# Patient Record
Sex: Male | Born: 1955 | Race: Black or African American | Hispanic: No | Smoking: Current every day smoker
Health system: Southern US, Community
[De-identification: ages and names within clinical notes are randomized; demographics above are authoritative.]

## PROBLEM LIST (undated history)

## (undated) ENCOUNTER — Emergency Department (HOSPITAL_COMMUNITY): Payer: Self-pay | Source: Home / Self Care

## (undated) DIAGNOSIS — C679 Malignant neoplasm of bladder, unspecified: Secondary | ICD-10-CM

## (undated) DIAGNOSIS — K922 Gastrointestinal hemorrhage, unspecified: Secondary | ICD-10-CM

## (undated) DIAGNOSIS — I714 Abdominal aortic aneurysm, without rupture, unspecified: Secondary | ICD-10-CM

## (undated) DIAGNOSIS — R079 Chest pain, unspecified: Secondary | ICD-10-CM

## (undated) HISTORY — PX: OTHER SURGICAL HISTORY: SHX169

---

## 2007-02-14 ENCOUNTER — Emergency Department: Payer: Self-pay | Admitting: Unknown Physician Specialty

## 2007-04-23 ENCOUNTER — Emergency Department: Payer: Self-pay | Admitting: Emergency Medicine

## 2008-09-18 ENCOUNTER — Emergency Department: Payer: Self-pay

## 2008-09-25 ENCOUNTER — Emergency Department: Payer: Self-pay | Admitting: Internal Medicine

## 2008-10-06 ENCOUNTER — Emergency Department: Payer: Self-pay | Admitting: Emergency Medicine

## 2009-01-09 ENCOUNTER — Emergency Department: Payer: Self-pay | Admitting: Emergency Medicine

## 2009-08-12 ENCOUNTER — Emergency Department: Payer: Self-pay | Admitting: Emergency Medicine

## 2009-08-18 ENCOUNTER — Emergency Department: Payer: Self-pay | Admitting: Unknown Physician Specialty

## 2010-03-30 ENCOUNTER — Emergency Department: Payer: Self-pay | Admitting: Emergency Medicine

## 2010-08-25 ENCOUNTER — Emergency Department: Payer: Self-pay

## 2012-08-07 ENCOUNTER — Emergency Department: Payer: Self-pay | Admitting: Emergency Medicine

## 2015-11-01 DIAGNOSIS — K922 Gastrointestinal hemorrhage, unspecified: Secondary | ICD-10-CM

## 2015-11-01 HISTORY — DX: Gastrointestinal hemorrhage, unspecified: K92.2

## 2015-11-12 ENCOUNTER — Encounter: Payer: Self-pay | Admitting: Emergency Medicine

## 2015-11-12 ENCOUNTER — Emergency Department: Payer: Self-pay

## 2015-11-12 ENCOUNTER — Emergency Department
Admission: EM | Admit: 2015-11-12 | Discharge: 2015-11-12 | Disposition: A | Discharge status: Home / Self Care | Payer: Self-pay | Attending: Emergency Medicine | Admitting: Emergency Medicine

## 2015-11-12 DIAGNOSIS — K2971 Gastritis, unspecified, with bleeding: Secondary | ICD-10-CM | POA: Insufficient documentation

## 2015-11-12 DIAGNOSIS — I714 Abdominal aortic aneurysm, without rupture, unspecified: Secondary | ICD-10-CM

## 2015-11-12 DIAGNOSIS — Z8551 Personal history of malignant neoplasm of bladder: Secondary | ICD-10-CM | POA: Insufficient documentation

## 2015-11-12 DIAGNOSIS — K922 Gastrointestinal hemorrhage, unspecified: Secondary | ICD-10-CM

## 2015-11-12 DIAGNOSIS — F17200 Nicotine dependence, unspecified, uncomplicated: Secondary | ICD-10-CM | POA: Insufficient documentation

## 2015-11-12 HISTORY — DX: Malignant neoplasm of bladder, unspecified: C67.9

## 2015-11-12 LAB — COMPREHENSIVE METABOLIC PANEL
ALBUMIN: 3.8 g/dL (ref 3.5–5.0)
ALK PHOS: 101 U/L (ref 38–126)
ALT: 16 U/L — AB (ref 17–63)
AST: 29 U/L (ref 15–41)
Anion gap: 7 (ref 5–15)
BILIRUBIN TOTAL: 0.3 mg/dL (ref 0.3–1.2)
BUN: 14 mg/dL (ref 6–20)
CO2: 22 mmol/L (ref 22–32)
CREATININE: 0.84 mg/dL (ref 0.61–1.24)
Calcium: 9 mg/dL (ref 8.9–10.3)
Chloride: 108 mmol/L (ref 101–111)
GFR calc Af Amer: >60 mL/min (ref >60)
GLUCOSE: 133 mg/dL — AB (ref 65–99)
POTASSIUM: 3.4 mmol/L — AB (ref 3.5–5.1)
Sodium: 137 mmol/L (ref 135–145)
TOTAL PROTEIN: 7.3 g/dL (ref 6.5–8.1)

## 2015-11-12 LAB — CBC
HEMATOCRIT: 39.7 % — AB (ref 40.0–52.0)
Hemoglobin: 13.2 g/dL (ref 13.0–18.0)
MCH: 31.4 pg (ref 26.0–34.0)
MCHC: 33.3 g/dL (ref 32.0–36.0)
MCV: 94.2 fL (ref 80.0–100.0)
PLATELETS: 133 10*3/uL — AB (ref 150–440)
RBC: 4.22 MIL/uL — ABNORMAL LOW (ref 4.40–5.90)
RDW: 16.6 % — AB (ref 11.5–14.5)
WBC: 4.9 10*3/uL (ref 3.8–10.6)

## 2015-11-12 MED ORDER — IOPAMIDOL (ISOVUE-300) INJECTION 61%
100.0000 mL | Freq: Once | INTRAVENOUS | Status: AC | PRN
  Administered 2015-11-12: 100 mL via INTRAVENOUS

## 2015-11-12 MED ORDER — DIATRIZOATE MEGLUMINE & SODIUM 66-10 % PO SOLN
15.0000 mL | Freq: Once | ORAL | Status: AC
  Administered 2015-11-12: 15 mL via ORAL

## 2015-11-12 MED ORDER — DOCUSATE SODIUM 100 MG PO CAPS: 100.0000 mg | ORAL_CAPSULE | Freq: Every day | ORAL | Status: AC | PRN

## 2015-11-12 NOTE — Discharge Instructions (Signed)
As we discussed it is extremely important that you follow up with the vascular surgeon, Dr. Lucky Cowboy. The aneurysm that you have in your abdomen needs to be evaluated by him. Additionally you will need repeat imaging of your abdomen in 6-12 months. Furthermore we will give you the number for urology. Please use the Colace and increase the fiber in her diet to help decrease the amount of straining you're doing on the toilet. Please seek medical attention for any high fevers, chest pain, shortness of breath, change in behavior, persistent vomiting, bloody stool or any other new or concerning symptoms.   Gastrointestinal Bleeding Gastrointestinal bleeding is bleeding somewhere along the path that food travels through the body (digestive tract). This path is anywhere between the mouth and the opening of the butt (anus). You may have blood in your throw up (vomit) or in your poop (stools). If there is a lot of bleeding, you may need to stay in the hospital. John Norris  Only take medicine as told by your doctor.  Eat foods with fiber such as whole grains, fruits, and vegetables. You can also try eating 1 to 3 prunes a day.  Drink enough fluids to keep your pee (urine) clear or pale yellow. GET HELP RIGHT AWAY IF:   Your bleeding gets worse.  You feel dizzy, weak, or you pass out (faint).  You have bad cramps in your back or belly (abdomen).  You have large blood clumps (clots) in your poop.  Your problems are getting worse. MAKE SURE YOU:   Understand these instructions.  Will watch your condition.  Will get help right away if you are not doing well or get worse.   This information is not intended to replace advice given to you by your health care provider. Make sure you discuss any questions you have with your health care provider.   Document Released: 03/28/2008 Document Revised: 06/05/2012 Document Reviewed: 12/07/2014 Elsevier Interactive Patient Education 2016 [place].   Blood  pumps away from the heart through tubes (blood vessels) called arteries. Aneurysms are weak or damaged places in the wall of an artery. It bulges out like a balloon. An abdominal aortic aneurysm happens in the main artery of the body (aorta). It can burst or tear, causing bleeding inside the body. This is an emergency. It needs treatment right away. CAUSES  The exact cause is unknown. Things that could cause this problem include:  Fat and other substances building up in the lining of a tube.  Swelling of the walls of a blood vessel.  Certain tissue diseases.  Belly (abdominal) trauma.  An infection in the main artery of the body. RISK FACTORS There are things that make it more likely for you to have an aneurysm. These include:  Being over the age of 62 years old.  Having high blood pressure (hypertension).  Being a male.  Being white.  Being very overweight (obese).  Having a family history of aneurysm.  Using tobacco products. PREVENTION To lessen your chance of getting this condition:  Stop smoking. Stop chewing tobacco.  Limit or avoid alcohol.  Keep your blood pressure, blood sugar, and cholesterol within normal limits.  Eat less salt.  Eat foods low in saturated fats and cholesterol. These are found in animal and whole dairy products.  Eat more fiber. Fiber is found in whole grains, vegetables, and fruits.  Keep a healthy weight.  Stay active and exercise often. SYMPTOMS Symptoms depend on the size of the aneurysm and  how fast it grows. There may not be symptoms. If symptoms occur, they can include:  Pain (belly, side, lower back, or groin).  Feeling full after eating a small amount of food.  Feeling sick to your stomach (nauseous), throwing up (vomiting), or both.  Feeling a lump in your belly that feels like it is beating (pulsating).  Feeling like you will pass out (faint). TREATMENT   Medicine to control blood pressure and pain.  Imaging tests  to see if the aneurysm gets bigger.  Surgery. MAKE SURE YOU:   Understand these instructions.  Will watch your condition.  Will get help right away if you are not doing well or get worse.   This information is not intended to replace advice given to you by your health care provider. Make sure you discuss any questions you have with your health care provider.   Document Released: 10/14/2012 Document Reviewed: 10/14/2012 Elsevier Interactive Patient Education [employer].

## 2015-11-12 NOTE — ED Provider Notes (Signed)
[facility] Emergency Department Provider Note   ____________________________________________  Time seen: ~2040  I have reviewed the triage vital signs and the nursing notes.   HISTORY  Chief Complaint Rectal Bleeding   History limited by: Not Limited   HPI John Norris is a 60 y.o. male who presents to the emergency department today because of concerns for GI bleed as well as bloody ejaculate. The patient states that he has been having problems with GI bleeds for roughly 1 year. He states that he finds he has to strain to defecate. It is typically after he strains that he will have what he stools. Describes it as bright red blood. In addition the patient states that for the past 2 days he has noticed bloody ejaculate. He denies any pain with this. He denies similar symptoms in the past. Patient does state he has a history of bladder cancer. He denies any primary care. He denies any recent follow-up.   Past Medical History  Diagnosis Date  . Bladder cancer ([place])     There are no active problems to display for this patient.   History reviewed. No pertinent past surgical history.  No current outpatient prescriptions on file.  Allergies Review of patient's allergies indicates no known allergies.  No family history on file.  Social History Social History  Substance Use Topics  . Smoking status: Current Every Day Smoker  . Smokeless tobacco: None  . Alcohol Use: Yes    Review of Systems  Constitutional: Negative for fever. Cardiovascular: Negative for chest pain. Respiratory: Negative for shortness of breath. Gastrointestinal: Positive for bloody stools Genitourinary: Positive for bloody ejaculate Neurological: Negative for headaches, focal weakness or numbness.  10-point ROS otherwise negative.  ____________________________________________   PHYSICAL EXAM:  VITAL SIGNS: ED Triage Vitals  Enc Vitals Group     BP 11/12/15 1826  157/83 mmHg     Pulse Rate 11/12/15 1826 76     Resp 11/12/15 1826 18     Temp 11/12/15 1826 98.2 F (36.8 C)     Temp Source 11/12/15 1826 Oral     SpO2 11/12/15 1826 97 %     Weight 11/12/15 1826 175 lb (79.379 kg)     Height 11/12/15 1826 5' 11.5" (1.816 m)   Constitutional: Alert and oriented. Well appearing and in no distress. Eyes: Conjunctivae are normal. PERRL. Normal extraocular movements. ENT   Head: Normocephalic and atraumatic.   Nose: No congestion/rhinnorhea.   Mouth/Throat: Mucous membranes are moist.   Neck: No stridor. Hematological/Lymphatic/Immunilogical: No cervical lymphadenopathy. Cardiovascular: Normal rate, regular rhythm.  No murmurs, rubs, or gallops. Respiratory: Normal respiratory effort without tachypnea nor retractions. Breath sounds are clear and equal bilaterally. No wheezes/rales/rhonchi. Gastrointestinal: Soft and nontender. No distention. There is no CVA tenderness. Genitourinary: Deferred Musculoskeletal: Normal range of motion in all extremities. No joint effusions.  No lower extremity tenderness nor edema. Neurologic:  Normal speech and language. No gross focal neurologic deficits are appreciated.  Skin:  Skin is warm, dry and intact. No rash noted. Psychiatric: Mood and affect are normal. Speech and behavior are normal. Patient exhibits appropriate insight and judgment.  ____________________________________________    LABS (pertinent positives/negatives)  Labs Reviewed  COMPREHENSIVE METABOLIC PANEL - Abnormal; Notable for the following:    Potassium 3.4 (*)    Glucose, Bld 133 (*)    ALT 16 (*)    All other components within normal limits  CBC - Abnormal; Notable for the following:  RBC 4.22 (*)    HCT 39.7 (*)    RDW 16.6 (*)    Platelets 133 (*)    All other components within normal limits     ____________________________________________   EKG  None  ____________________________________________     RADIOLOGY  CT abd/pel IMPRESSION: Moderate stool throughout the colon. No evidence of bowel obstruction or active inflammation. Normal appendix.  A 5 cm partially thrombosed infrarenal abdominal aortic aneurysm, new from prior study. Recommend followup by abdomen and pelvis CTA in 3-6 months, and vascular surgery referral/consultation if not already obtained. This recommendation follows ACR consensus guidelines: White Paper of the ACR Incidental Findings Committee II on Vascular Findings. J Am Coll Radiol 2013; 10:789-794.  Bilateral common iliac artery aneurysm.   ____________________________________________   PROCEDURES  Procedure(s) performed: None  Critical Care performed: No  ____________________________________________   INITIAL IMPRESSION / ASSESSMENT AND PLAN / ED COURSE  Pertinent labs & imaging results that were available during my care of the patient were reviewed by me and considered in my medical decision making (see chart for details).  Patient presents to the emergency department today because of concerns for GI bleeding and bloody ejaculate. The patient said somewhat chronic issue with GI bleed. He does say is worse with strains. Think likely this could be secondary to hemorrhoids. However he is now also complaining of bloody ejaculate. Patient does have a history of bladder cancer and appears has not had any recent follow-up. Go my concern for possible new cancer lesion will obtain a CT scan of the abdomen and pelvis.  ____________________________________________   FINAL CLINICAL IMPRESSION(S) / ED DIAGNOSES  Final diagnoses:  Gastrointestinal hemorrhage, unspecified gastritis, unspecified gastrointestinal hemorrhage type  Abdominal aortic aneurysm (AAA) without rupture (HCC)     Nance Pear, MD 11/12/15 2334

## 2015-11-12 NOTE — ED Notes (Signed)
Informed CT that pt has completed contrast and IV has been established.

## 2015-11-12 NOTE — ED Notes (Signed)
Discussed IV options with patient, AC placement preferred. 

## 2015-11-12 NOTE — ED Notes (Signed)
Pt has completed drinking oral contrast, calling CT to come get him for his scan.

## 2015-11-12 NOTE — ED Notes (Signed)
Patient to ER for c/o rectal bleeding. States he notices bleeding more when straining to have BM. Has h/o bladder cancer, and had blood in urine as first symptom.

## 2015-11-12 NOTE — ED Notes (Signed)
Patient also c/o blood in sperm, stating it is "sort of dark red" and he has not had any trauma to the area he can recall.

## 2015-11-12 NOTE — ED Notes (Signed)
Patient requested me to talk to his daughter regarding his AAA findings.  I urged the need for him to follow up with a cardiologist ASAP.

## 2015-12-18 ENCOUNTER — Emergency Department
Admission: EM | Admit: 2015-12-18 | Discharge: 2015-12-18 | Payer: Self-pay | Attending: Emergency Medicine | Admitting: Emergency Medicine

## 2015-12-18 ENCOUNTER — Ambulatory Visit (HOSPITAL_COMMUNITY): Admit: 2015-12-18 | Payer: Self-pay | Admitting: Cardiology

## 2015-12-18 ENCOUNTER — Inpatient Hospital Stay (HOSPITAL_COMMUNITY)
Admission: EM | Admit: 2015-12-18 | Discharge: 2015-12-20 | DRG: 287 | Disposition: A | Discharge status: Home / Self Care | Payer: Self-pay | Source: Other Acute Inpatient Hospital | Attending: Cardiology | Admitting: Cardiology

## 2015-12-18 ENCOUNTER — Encounter
Admission: EM | Disposition: A | Discharge status: Other Acute Inpatient Hospital | Payer: Self-pay | Source: Home / Self Care | Attending: Emergency Medicine

## 2015-12-18 ENCOUNTER — Emergency Department: Payer: Self-pay

## 2015-12-18 ENCOUNTER — Encounter: Payer: Self-pay | Admitting: *Deleted

## 2015-12-18 ENCOUNTER — Encounter (HOSPITAL_COMMUNITY): Payer: Self-pay | Admitting: *Deleted

## 2015-12-18 DIAGNOSIS — I2119 ST elevation (STEMI) myocardial infarction involving other coronary artery of inferior wall: Secondary | ICD-10-CM | POA: Diagnosis present

## 2015-12-18 DIAGNOSIS — I209 Angina pectoris, unspecified: Secondary | ICD-10-CM | POA: Insufficient documentation

## 2015-12-18 DIAGNOSIS — I714 Abdominal aortic aneurysm, without rupture: Secondary | ICD-10-CM | POA: Diagnosis present

## 2015-12-18 DIAGNOSIS — E785 Hyperlipidemia, unspecified: Secondary | ICD-10-CM | POA: Diagnosis present

## 2015-12-18 DIAGNOSIS — I713 Abdominal aortic aneurysm, ruptured, unspecified: Secondary | ICD-10-CM | POA: Diagnosis present

## 2015-12-18 DIAGNOSIS — F17200 Nicotine dependence, unspecified, uncomplicated: Secondary | ICD-10-CM | POA: Insufficient documentation

## 2015-12-18 DIAGNOSIS — Z8551 Personal history of malignant neoplasm of bladder: Secondary | ICD-10-CM

## 2015-12-18 DIAGNOSIS — I2510 Atherosclerotic heart disease of native coronary artery without angina pectoris: Secondary | ICD-10-CM

## 2015-12-18 DIAGNOSIS — Z87891 Personal history of nicotine dependence: Secondary | ICD-10-CM

## 2015-12-18 DIAGNOSIS — R072 Precordial pain: Principal | ICD-10-CM | POA: Diagnosis present

## 2015-12-18 DIAGNOSIS — Z79899 Other long term (current) drug therapy: Secondary | ICD-10-CM

## 2015-12-18 DIAGNOSIS — I213 ST elevation (STEMI) myocardial infarction of unspecified site: Secondary | ICD-10-CM | POA: Insufficient documentation

## 2015-12-18 HISTORY — DX: Chest pain, unspecified: R07.9

## 2015-12-18 HISTORY — DX: Abdominal aortic aneurysm, without rupture: I71.4

## 2015-12-18 HISTORY — PX: CARDIAC CATHETERIZATION: SHX172

## 2015-12-18 HISTORY — DX: Gastrointestinal hemorrhage, unspecified: K92.2

## 2015-12-18 HISTORY — DX: Abdominal aortic aneurysm, without rupture, unspecified: I71.40

## 2015-12-18 LAB — TROPONIN I
Troponin I: <0.03 ng/mL (ref <0.031)
Troponin I: <0.03 ng/mL (ref <0.031)

## 2015-12-18 LAB — COMPREHENSIVE METABOLIC PANEL
ALBUMIN: 4 g/dL (ref 3.5–5.0)
ALT: 13 U/L — ABNORMAL LOW (ref 17–63)
AST: 22 U/L (ref 15–41)
Alkaline Phosphatase: 97 U/L (ref 38–126)
Anion gap: 9 (ref 5–15)
BUN: 20 mg/dL (ref 6–20)
CHLORIDE: 109 mmol/L (ref 101–111)
CO2: 19 mmol/L — AB (ref 22–32)
Calcium: 9.2 mg/dL (ref 8.9–10.3)
Creatinine, Ser: 1.04 mg/dL (ref 0.61–1.24)
GFR calc Af Amer: >60 mL/min (ref >60)
Glucose, Bld: 61 mg/dL — ABNORMAL LOW (ref 65–99)
POTASSIUM: 4.2 mmol/L (ref 3.5–5.1)
SODIUM: 137 mmol/L (ref 135–145)
Total Bilirubin: 0.6 mg/dL (ref 0.3–1.2)
Total Protein: 8.4 g/dL — ABNORMAL HIGH (ref 6.5–8.1)

## 2015-12-18 LAB — PROTIME-INR
INR: 1.05
Prothrombin Time: 13.9 seconds (ref 11.4–15.0)

## 2015-12-18 LAB — CBC WITH DIFFERENTIAL/PLATELET
BASOS ABS: 0 10*3/uL (ref 0–0.1)
BASOS PCT: 1 %
EOS ABS: 0 10*3/uL (ref 0–0.7)
EOS PCT: 1 %
HCT: 42.4 % (ref 40.0–52.0)
Hemoglobin: 14.2 g/dL (ref 13.0–18.0)
Lymphocytes Relative: 50 %
Lymphs Abs: 2.6 10*3/uL (ref 1.0–3.6)
MCH: 30.7 pg (ref 26.0–34.0)
MCHC: 33.5 g/dL (ref 32.0–36.0)
MCV: 91.6 fL (ref 80.0–100.0)
MONO ABS: 0.4 10*3/uL (ref 0.2–1.0)
Monocytes Relative: 7 %
Neutro Abs: 2.1 10*3/uL (ref 1.4–6.5)
Neutrophils Relative %: 41 %
PLATELETS: 136 10*3/uL — AB (ref 150–440)
RBC: 4.62 MIL/uL (ref 4.40–5.90)
RDW: 15.2 % — AB (ref 11.5–14.5)
WBC: 5.2 10*3/uL (ref 3.8–10.6)

## 2015-12-18 LAB — TSH: TSH: 0.256 u[IU]/mL — AB (ref 0.350–4.500)

## 2015-12-18 LAB — SEDIMENTATION RATE: SED RATE: 11 mm/h (ref 0–16)

## 2015-12-18 LAB — ETHANOL: ALCOHOL ETHYL (B): 24 mg/dL — AB (ref <5)

## 2015-12-18 LAB — APTT: aPTT: 33 seconds (ref 24–36)

## 2015-12-18 SURGERY — LEFT HEART CATH AND CORONARY ANGIOGRAPHY
Anesthesia: LOCAL

## 2015-12-18 MED ORDER — HEPARIN (PORCINE) IN NACL 2-0.9 UNIT/ML-% IJ SOLN
INTRAMUSCULAR | Status: DC | PRN
  Administered 2015-12-18: 08:00:00 via INTRA_ARTERIAL

## 2015-12-18 MED ORDER — ASPIRIN 81 MG PO CHEW
CHEWABLE_TABLET | ORAL | Status: AC
  Administered 2015-12-18: 324 mg via ORAL
  Filled 2015-12-18: qty 4

## 2015-12-18 MED ORDER — ACETAMINOPHEN 325 MG PO TABS: 650.0000 mg | ORAL_TABLET | ORAL | Status: DC | PRN

## 2015-12-18 MED ORDER — NITROGLYCERIN 0.4 MG SL SUBL: 0.4000 mg | SUBLINGUAL_TABLET | SUBLINGUAL | Status: DC | PRN

## 2015-12-18 MED ORDER — ONDANSETRON HCL 4 MG/2ML IJ SOLN: 4.0000 mg | Freq: Four times a day (QID) | INTRAMUSCULAR | Status: DC | PRN

## 2015-12-18 MED ORDER — NITROGLYCERIN 2 % TD OINT
TOPICAL_OINTMENT | TRANSDERMAL | Status: AC
  Administered 2015-12-18: 0.5 [in_us]
  Filled 2015-12-18: qty 1

## 2015-12-18 MED ORDER — HEPARIN SODIUM (PORCINE) 1000 UNIT/ML IJ SOLN: INTRAMUSCULAR | Status: DC | PRN

## 2015-12-18 MED ORDER — HEPARIN SODIUM (PORCINE) 5000 UNIT/ML IJ SOLN: 60.0000 [IU]/kg | Freq: Once | INTRAMUSCULAR | Status: DC

## 2015-12-18 MED ORDER — FENTANYL CITRATE (PF) 100 MCG/2ML IJ SOLN
INTRAMUSCULAR | Status: DC | PRN
  Administered 2015-12-18: 25 ug via INTRAVENOUS

## 2015-12-18 MED ORDER — HEPARIN BOLUS VIA INFUSION
4000.0000 [IU] | Freq: Once | INTRAVENOUS | Status: AC
  Administered 2015-12-18: 4000 [IU] via INTRAVENOUS
  Filled 2015-12-18: qty 4000

## 2015-12-18 MED ORDER — ENOXAPARIN SODIUM 40 MG/0.4ML ~~LOC~~ SOLN
40.0000 mg | SUBCUTANEOUS | Status: DC
Start: 2015-12-19 — End: 2015-12-20
  Administered 2015-12-18 – 2015-12-19 (×2): 40 mg via SUBCUTANEOUS
  Filled 2015-12-18 (×2): qty 0.4

## 2015-12-18 MED ORDER — HEPARIN (PORCINE) IN NACL 100-0.45 UNIT/ML-% IJ SOLN: 14.0000 [IU]/kg/h | Freq: Once | INTRAMUSCULAR | Status: DC

## 2015-12-18 MED ORDER — SODIUM CHLORIDE 0.9 % IV SOLN: 250.0000 mL | INTRAVENOUS | Status: DC | PRN

## 2015-12-18 MED ORDER — SODIUM CHLORIDE 0.9% FLUSH: 3.0000 mL | INTRAVENOUS | Status: DC | PRN

## 2015-12-18 MED ORDER — ZOLPIDEM TARTRATE 5 MG PO TABS: 5.0000 mg | ORAL_TABLET | Freq: Every evening | ORAL | Status: DC | PRN

## 2015-12-18 MED ORDER — IOPAMIDOL (ISOVUE-370) INJECTION 76%
INTRAVENOUS | Status: DC | PRN
  Administered 2015-12-18: 85 mL via INTRA_ARTERIAL

## 2015-12-18 MED ORDER — ATORVASTATIN CALCIUM 80 MG PO TABS
80.0000 mg | ORAL_TABLET | Freq: Every day | ORAL | Status: DC
  Administered 2015-12-18 – 2015-12-19 (×2): 80 mg via ORAL
  Filled 2015-12-18 (×2): qty 1

## 2015-12-18 MED ORDER — ASPIRIN 81 MG PO CHEW
324.0000 mg | CHEWABLE_TABLET | Freq: Once | ORAL | Status: AC
  Administered 2015-12-18: 324 mg via ORAL

## 2015-12-18 MED ORDER — HEPARIN SODIUM (PORCINE) 5000 UNIT/ML IJ SOLN
INTRAMUSCULAR | Status: AC
  Filled 2015-12-18: qty 1

## 2015-12-18 MED ORDER — SODIUM CHLORIDE 0.9% FLUSH
3.0000 mL | Freq: Two times a day (BID) | INTRAVENOUS | Status: DC
  Administered 2015-12-19 (×2): 3 mL via INTRAVENOUS

## 2015-12-18 MED ORDER — HEPARIN (PORCINE) IN NACL 100-0.45 UNIT/ML-% IJ SOLN
950.0000 [IU]/h | INTRAMUSCULAR | Status: DC
  Administered 2015-12-18: 950 [IU]/h via INTRAVENOUS
  Filled 2015-12-18: qty 250

## 2015-12-18 MED ORDER — ALPRAZOLAM 0.25 MG PO TABS: 0.2500 mg | ORAL_TABLET | Freq: Two times a day (BID) | ORAL | Status: DC | PRN

## 2015-12-18 MED ORDER — HEPARIN (PORCINE) IN NACL 2-0.9 UNIT/ML-% IJ SOLN
INTRAMUSCULAR | Status: DC | PRN
  Administered 2015-12-18: 1000 mL

## 2015-12-18 MED ORDER — LIDOCAINE HCL (PF) 1 % IJ SOLN
INTRAMUSCULAR | Status: DC | PRN
  Administered 2015-12-18: 2 mL

## 2015-12-18 SURGICAL SUPPLY — 11 items
CATH INFINITI 5FR ANG PIGTAIL (CATHETERS) ×2 IMPLANT
CATH OPTITORQUE TIG 4.0 5F (CATHETERS) ×2 IMPLANT
DEVICE RAD COMP TR BAND LRG (VASCULAR PRODUCTS) ×2 IMPLANT
ELECT DEFIB PAD ADLT CADENCE (PAD) ×2 IMPLANT
GLIDESHEATH SLEND A-KIT 6F 22G (SHEATH) ×2 IMPLANT
KIT HEART LEFT (KITS) ×2 IMPLANT
PACK CARDIAC CATHETERIZATION (CUSTOM PROCEDURE TRAY) ×2 IMPLANT
SYR MEDRAD MARK V 150ML (SYRINGE) ×2 IMPLANT
TRANSDUCER W/STOPCOCK (MISCELLANEOUS) ×2 IMPLANT
TUBING CIL FLEX 10 FLL-RA (TUBING) ×2 IMPLANT
WIRE SAFE-T 1.5MM-J .035X260CM (WIRE) ×2 IMPLANT

## 2015-12-18 NOTE — ED Notes (Signed)
Pt presents w/ sudden onset of L chest pain, non-radiating and reproducible w/ movement and palpation. Pt denies associated sxs of dyspnea and diaphoresis.

## 2015-12-18 NOTE — ED Provider Notes (Signed)
[facility] Emergency Department Provider Note   ____________________________________________  Time seen: Approximately 5:39 AM  I have reviewed the triage vital signs and the nursing notes.   HISTORY  Chief Complaint Chest Pain    HPI SUTTON LESSMAN is a 60 y.o. male who presents to the ED from home with a chief complaint of chest pain. Patient reports left sided sharp chest pain awakening him from sleep at 5 AM. Pain is worsened by palpation and movement. Symptoms are not associated with diaphoresis, shortness of breath, nausea or vomiting. Recently patient had an evaluation in the emergency department for rectal bleeding. CT had an incidental finding of partially thrombosed 5 cm aortic aneurysm without rupture; patient states he was discharged with vascular surgery follow-up.Complains of cough productive of yellow sputum. Denies associated fever, chills, abdominal pain, diarrhea.   Past Medical History  Diagnosis Date  . Bladder cancer ([place])     There are no active problems to display for this patient.   No past surgical history on file.  Current Outpatient Rx  Name  Route  Sig  Dispense  Refill  . docusate sodium (COLACE) 100 MG capsule   Oral   Take 1 capsule (100 mg total) by mouth daily as needed.   30 capsule   2     Allergies Review of patient's allergies indicates no known allergies.  No family history on file.  Social History Social History  Substance Use Topics  . Smoking status: Current Every Day Smoker  . Smokeless tobacco: Not on file  . Alcohol Use: Yes  Denies illicit drug use  Review of Systems  Constitutional: No fever/chills Eyes: No visual changes. ENT: No sore throat. Cardiovascular: Positive for chest pain. Respiratory: Denies shortness of breath. Gastrointestinal: No abdominal pain.  No nausea, no vomiting.  No diarrhea.  No constipation. Genitourinary: Negative for dysuria. Musculoskeletal: Negative  for back pain. Skin: Negative for rash. Neurological: Negative for headaches, focal weakness or numbness.  10-point ROS otherwise negative.  ____________________________________________   PHYSICAL EXAM:  VITAL SIGNS: ED Triage Vitals  Enc Vitals Group     BP --      Pulse --      Resp --      Temp --      Temp src --      SpO2 --      Weight --      Height --      Head Cir --      Peak Flow --      Pain Score --      Pain Loc --      Pain Edu? --      Excl. in [place]? --     Constitutional: Alert and oriented. Well appearing and in mild acute distress. Eyes: Conjunctivae are normal. PERRL. EOMI. Head: Atraumatic. Nose: No congestion/rhinnorhea. Mouth/Throat: Mucous membranes are moist.  Oropharynx non-erythematous. Neck: No stridor.   Cardiovascular: Normal rate, regular rhythm. Grossly normal heart sounds.  Good peripheral circulation. Respiratory: Normal respiratory effort.  No retractions. Lungs CTAB. Gastrointestinal: Soft and nontender. No distention. No abdominal bruits. No CVA tenderness. Musculoskeletal: No lower extremity tenderness nor edema.  No joint effusions. Neurologic:  Normal speech and language. No gross focal neurologic deficits are appreciated.  Skin:  Skin is warm, dry and intact. No rash noted. Psychiatric: Mood and affect are normal. Speech and behavior are normal.  ____________________________________________   LABS (all labs ordered are listed, but only abnormal  results are displayed)  Labs Reviewed  ETHANOL  CBC WITH DIFFERENTIAL/PLATELET  COMPREHENSIVE METABOLIC PANEL  TROPONIN I   ____________________________________________  EKG  ED ECG REPORT I, Dontaye Hur J, the attending physician, personally viewed and interpreted this ECG.   Date: 12/18/2015  EKG Time: 0528  Rate: 75  Rhythm: normal EKG, normal sinus rhythm  Axis: WNL  Intervals:none  ST&T Change: STEMI  inferiorly  ____________________________________________  RADIOLOGY  Chest x-ray (viewed by me, interpreted per Dr. Jeannine Boga): No active cardiopulmonary disease. ____________________________________________   PROCEDURES  Procedure(s) performed: None  Critical Care performed: No  ____________________________________________   INITIAL IMPRESSION / ASSESSMENT AND PLAN / ED COURSE  Pertinent labs & imaging results that were available during my care of the patient were reviewed by me and considered in my medical decision making (see chart for details).  60 year old male who presents with chest pain. EKG is concerning for inferior ST elevation MI. Discussed with Dr. Ellyn Hack (interventional cardiologist on-call for [facility]) who agrees and accepts patient in transfer to the Cath Lab. 324 mg aspirin administered. Will initiate heparin bolus with drip and nitroglycerin paste. ____________________________________________   FINAL CLINICAL IMPRESSION(S) / ED DIAGNOSES  Final diagnoses:  Ischemic chest pain ([place])  ST elevation myocardial infarction (STEMI), unspecified artery (HCC)      NEW MEDICATIONS STARTED DURING THIS VISIT:  New Prescriptions   No medications on file     Note:  This document was prepared using Dragon voice recognition software and may include unintentional dictation errors.    Paulette Blanch, MD 12/18/15 [phone]

## 2015-12-18 NOTE — H&P (Signed)
History and Physical Note:   NAME:  John Norris   MRN: [id number] DOB:  10/29/1955   ADMIT DATE: 12/18/2015  12/18/2015 6:52 AM  John Norris is a 60 y.o. male long-term smoker with bladder cancer and recent possible GI bleed, who presented to [facility] emergency room at roughly 5:45 AM on 12/18/2015 after having been awoken from sleep at roughly for the morning with left-sided chest pain that radiated to the arm. She felt that it was possibly musculoskeletal, but when the symptoms progressed to become the ER. In the ER he was evaluated for a gamma was thought to be musculoskeletal pain, however his EKG was concerning for ST elevations in the inferior leads with strange morphology. There is also acute ST segments in a corneal leads. Reviewed the EKGs and there indeed roughly 2 mm to elevations in leads II, room 3 and aVF with potentially real early report changes and hyperacute T waves in leads V2-V6.   Upon arrival to [facility] at 0640 hrs., he was still having 3 out 10 pain down from initial 7 out of 10. At [facility] he received 4000 units IV heparin and was on heparin drip. He also received 324 of aspirin and was started on nitroglycerin paste.  Past Medical History  Diagnosis Date  . Bladder cancer ([place])   . Abdominal aortic aneurysm (AAA) without rupture (HCC)     Roughly 5 cm partially thrombotic. Also, treated by bilateral iliac aneurysms  . GI bleed May 2017   Past Surgical History  Procedure Laterality Date  . Exploratory abdominal surgery      4 what was possibly appendicitis versus tumor    - He did have an abdominal surgery to remove some type of undiagnosed tumor versus appendicitis. He has a large abdominal incision scar.  FAMHx: History reviewed. No pertinent family history. He is not aware of any cardiac disease history in his family  SOCHx:  reports that he has been smoking.  He has never used smokeless tobacco. He reports that he drinks alcohol. He reports that he does  not use illicit drugs.  ALLERGIES: No Known Allergies  HOME MEDICATIONS: Prior to Admission medications   Medication Sig Start Date End Date Taking? Authorizing Provider  docusate sodium (COLACE) 100 MG capsule Take 1 capsule (100 mg total) by mouth daily as needed. 11/12/15 11/11/16  Nance Pear, MD   Review of Systems  Constitutional: Negative for fever.  HENT: Negative for nosebleeds.   Cardiovascular: Positive for chest pain, orthopnea and PND. Negative for palpitations and claudication.  Gastrointestinal: Positive for blood in stool. Negative for heartburn, abdominal pain and melena.  Genitourinary: Positive for frequency and hematuria.  Musculoskeletal: Negative for myalgias and joint pain.  Neurological: Negative for dizziness, loss of consciousness, weakness and headaches.  Endo/Heme/Allergies: Does not bruise/bleed easily.  Psychiatric/Behavioral: Negative for depression and memory loss. The patient is not nervous/anxious and does not have insomnia.   All other systems reviewed and are negative.   PHYSICAL EXAM:Blood pressure 128/92, pulse 72, temperature 97.7 F (36.5 C), temperature source Oral, resp. rate 18, height 5\' 6"  (1.676 m), weight 180 lb (81.647 kg), SpO2 98 %. General appearance: alert, cooperative, appears stated age, moderate distress and Appears uncomfortable, but nontoxic. Pleasant mood and affect Neck: no adenopathy, no carotid bruit and no JVD Lungs: clear to auscultation bilaterally, normal percussion bilaterally and Nonlabored, good air movement Heart: normal apical impulse and Tachycardic with regular rhythm. Occasional ectopy. No obvious M/R/G Abdomen:  soft, non-tender; bowel sounds normal; no masses,  no organomegaly Extremities: No clubbing/cyanosis or edema. Pulses: 2+ and symmetric Skin: Skin color, texture, turgor normal. No rashes or lesions Neurologic: Mental status: Alert, oriented, thought content appropriate Cranial nerves: normal    Adult ECG Report  Rate: 75 ;  Rhythm: normal sinus rhythm  QRS Axis: 74 ;  PR Interval: 14 ;  QRS Duration: 94 ; QTc: 417;  Voltages: Normal There is roughly 2 mm ST elevations in II, Ronald 3 and aVF with mild Q waves. Also hyperacute ST segments/T waves in V2-V6. Subtle criteria however injury pattern ST segment elevation cannot be excluded  Narrative Interpretation: Abnormal EKG. Cannot exclude inferior/lateral STEMI  IMPRESSION & PLAN LAYSON LIGOCKI has presented today for surgery, with the diagnosis of inferior-inferolateral STEMI.  Various methods of treatment have been discussed with the patient and family.   Risks / Complications include, but not limited to: Death, MI, CVA/TIA, VF/VT (with defibrillation), Bradycardia (need for temporary pacer placement), contrast induced nephropathy, bleeding / bruising / hematoma / pseudoaneurysm, vascular or coronary injury (with possible emergent CT or Vascular Surgery), adverse medication reactions, infection.     After consideration of risks, benefits and other options for treatment, the patient has consented to Procedure(s): Emergency consent implied [place] +/- AD [place]   as a surgical intervention.   We will proceed with the planned procedure. Would likely consider bare metal stent given his recent GI bleed and hematuria from bladder cancer. Further recommendations pending results of cath.     Glenetta Hew, M.D., M.S. Interventional Cardiologist   Pager # [phone] Phone # [phone] [address] [place], [place] [address]   12/18/2015 6:52 AM

## 2015-12-18 NOTE — Progress Notes (Addendum)
ANTICOAGULATION CONSULT NOTE - Initial Consult  Pharmacy Consult for heparin drip Indication: STEMI  No Known Allergies  Patient Measurements: Height: 5\' 6"  (167.6 cm) Weight: 180 lb (81.647 kg) IBW/kg (Calculated) : 63.8 Heparin Dosing Weight: 80kg  Vital Signs: Temp: 97.7 F (36.5 C) (06/17 0559) Temp Source: Oral (06/17 0559) BP: 128/92 mmHg (06/17 0600) Pulse Rate: 72 (06/17 0600)  Labs:  Recent Labs  12/18/15 0545  HGB 14.2  HCT 42.4  PLT 136*    CrCl cannot be calculated (Patient has no serum creatinine result on file.).   Medical History: Past Medical History  Diagnosis Date  . Bladder cancer (HCC)     Medications:  No anticoagulation as outpatient per PTA meds.  Assessment: PT/INR and aPTT ordered. First heparin bag sent to ED per RN/MD urgent request. Asked ED RN to draw baseline labs before starting infusion.   Goal of Therapy:  Heparin level 0.3-0.7 units/ml Monitor platelets by anticoagulation protocol: Yes   Plan:  4000 unit bolus and initial rate of 950 units/hr. First heparin level 6 hours after start of infusion.  06:45: Patient has transferred to another facility.  Yunique Dearcos S 12/18/2015,6:12 AM

## 2015-12-19 ENCOUNTER — Observation Stay (HOSPITAL_BASED_OUTPATIENT_CLINIC_OR_DEPARTMENT_OTHER): Payer: Self-pay

## 2015-12-19 DIAGNOSIS — R079 Chest pain, unspecified: Secondary | ICD-10-CM

## 2015-12-19 DIAGNOSIS — I2119 ST elevation (STEMI) myocardial infarction involving other coronary artery of inferior wall: Secondary | ICD-10-CM

## 2015-12-19 LAB — ECHOCARDIOGRAM COMPLETE
CHL CUP MV DEC (S): 257
E decel time: 257 msec
EERAT: 4.28
FS: 15 % — AB (ref 28–44)
Height: 66 in
IV/PV OW: 0.9
LA diam index: 1.64 cm/m2
LA vol A4C: 39 ml
LA vol index: 21.2 mL/m2
LASIZE: 31 mm
LAVOL: 40.1 mL
LEFT ATRIUM END SYS DIAM: 31 mm
LV E/e' medial: 4.28
LV PW d: 11.6 mm — AB (ref 0.6–1.1)
LV TDI E'MEDIAL: 7.72
LVEEAVG: 4.28
LVELAT: 12 cm/s
LVOT area: 3.46 cm2
LVOTD: 21 mm
Lateral S' vel: 10 cm/s
MVPKAVEL: 54.8 m/s
MVPKEVEL: 51.4 m/s
TAPSE: 21.3 mm
TDI e' lateral: 12
Weight: 2666.68 oz

## 2015-12-19 LAB — BASIC METABOLIC PANEL
Anion gap: 6 (ref 5–15)
BUN: 15 mg/dL (ref 6–20)
CALCIUM: 9.2 mg/dL (ref 8.9–10.3)
CHLORIDE: 107 mmol/L (ref 101–111)
CO2: 23 mmol/L (ref 22–32)
CREATININE: 1 mg/dL (ref 0.61–1.24)
GFR calc non Af Amer: >60 mL/min (ref >60)
Glucose, Bld: 91 mg/dL (ref 65–99)
Potassium: 4 mmol/L (ref 3.5–5.1)
SODIUM: 136 mmol/L (ref 135–145)

## 2015-12-19 LAB — CBC
HCT: 38.7 % — ABNORMAL LOW (ref 39.0–52.0)
Hemoglobin: 13 g/dL (ref 13.0–17.0)
MCH: 29.9 pg (ref 26.0–34.0)
MCHC: 33.6 g/dL (ref 30.0–36.0)
MCV: 89 fL (ref 78.0–100.0)
PLATELETS: 141 10*3/uL — AB (ref 150–400)
RBC: 4.35 MIL/uL (ref 4.22–5.81)
RDW: 14.4 % (ref 11.5–15.5)
WBC: 5.2 10*3/uL (ref 4.0–10.5)

## 2015-12-19 LAB — T4, FREE: Free T4: 0.98 ng/dL (ref 0.61–1.12)

## 2015-12-19 LAB — LIPID PANEL
CHOLESTEROL: 183 mg/dL (ref 0–200)
HDL: 48 mg/dL (ref >40)
LDL Cholesterol: 125 mg/dL — ABNORMAL HIGH (ref 0–99)
Total CHOL/HDL Ratio: 3.8 RATIO
Triglycerides: 50 mg/dL (ref <150)
VLDL: 10 mg/dL (ref 0–40)

## 2015-12-19 NOTE — Progress Notes (Signed)
Patient was educated on the fall and safety plan. Patient refuses the bed alarm to be turned on. [facility] [employer]

## 2015-12-19 NOTE — Progress Notes (Signed)
  Echocardiogram 2D Echocardiogram has been performed.  John Norris 12/19/2015, 4:59 PM

## 2015-12-19 NOTE — Progress Notes (Signed)
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 Admitted 06/17 w/ CP, ?STEMI, non-obs CAD at cath, echo ordered  SUBJECTIVE: CP has resolved. Pt has appt with Cardiology 06/29 at [facility] to eval for aneurysm "bubble above my heart"  OBJECTIVE Filed Vitals:   12/18/15 0800 12/18/15 2100 12/19/15 0535  BP: 112/87 116/75 125/84  Pulse: 80 76 70  Temp: 97.7 F (36.5 C) 98.1 F (36.7 C) 98.1 F (36.7 C)  TempSrc: Oral Oral Oral  Resp: 18 21 18   Weight:   166 lb 10.7 oz (75.6 kg)  SpO2: 92% 98% 99%    Intake/Output Summary (Last 24 hours) at 12/19/15 0830 Last data filed at 12/19/15 0400  Gross per 24 hour  Intake    963 ml  Output      0 ml  Net    963 ml   Filed Weights   12/19/15 0535  Weight: 166 lb 10.7 oz (75.6 kg)    PHYSICAL EXAM General: Well developed, well nourished, male in no acute distress. Head: Normocephalic, atraumatic.  Neck: Supple without bruits, JVD not elevated. Lungs:  Resp regular and unlabored, slightly decreased BS bases. Heart: RRR, S1, S2, no S3, S4, or murmur; no rub. Abdomen: Soft, non-tender, non-distended, BS + x 4.  Extremities: No clubbing, cyanosis, edema. R radial cath site without ecchymosis or hematoma Neuro: Alert and oriented X 3. Moves all extremities spontaneously. Psych: Normal affect.  LABS: CBC: Recent Labs  12/18/15 0545 12/19/15 0240  WBC 5.2 5.2  NEUTROABS 2.1  --   HGB 14.2 13.0  HCT 42.4 38.7*  MCV 91.6 89.0  PLT 136* 141*   INR: Recent Labs  12/18/15 0545  INR 123456   Basic Metabolic Panel: Recent Labs  12/18/15 0545 12/19/15 0240    NA 137 136  K 4.2 4.0  CL 109 107  CO2 19* 23  GLUCOSE 61* 91  BUN 20 15  CREATININE 1.04 1.00  CALCIUM 9.2 9.2   Liver Function Tests: Recent Labs  12/18/15 0545  AST 22  ALT 13*  ALKPHOS 97  BILITOT 0.6  PROT 8.4*  ALBUMIN 4.0   Cardiac Enzymes: Recent Labs  12/18/15 0834 12/18/15 1526 12/18/15 2012  TROPONINI <0.03 <0.03 <0.03   Fasting Lipid Panel: Recent Labs  12/19/15 0240  CHOL 183  HDL 48  LDLCALC 125*  TRIG 50  CHOLHDL 3.8   Thyroid Function Tests: Recent Labs  12/18/15 0834  TSH 0.256*   TELE:  SR, S brady w/ occ HR high 40s, no sx      ECath: 06/17 1.  Ost 2nd Diag to 2nd Diag lesion, 55% stenosed. Not likely culprit lesion for essentially resting chest pain 2. The left ventricular systolic function is normal.  No angiographic evidence of any significant CAD that would explain the patient's symptoms of chest pain.  With diffuse ST elevations on EKG associate with chest pain, cannot exclude pericarditis.  Plan: Admit to step down unit on 3 W. with standard posterior removal post cath.  Check 2-D echocardiogram to evaluate enlarged cardiac silhouette noted on fluoroscopy - consider possible pericardial effusion/recurrence. Hold off on  antihypertensives for now.  Radiology/Studies: Dg Chest Port 1 View  12/18/2015  CLINICAL DATA:  Initial evaluation for acute chest pain. History smoking. EXAM: PORTABLE CHEST 1 VIEW COMPARISON:  None. FINDINGS: Cardiac and mediastinal silhouettes are within normal limits. Mild tortuosity of the intrathoracic aorta noted. Lungs normally inflated. Mild elevation left hemidiaphragm. No focal infiltrate, pulmonary edema, or pleural effusion. No pneumothorax. No acute osseous abnormality. IMPRESSION: No active cardiopulmonary disease. Electronically Signed   By: John Norris M.D.   On: 12/18/2015 05:59     Current Medications:  . atorvastatin  80 mg Oral q1800  . enoxaparin (LOVENOX) injection  40 mg  Subcutaneous Q24H  . sodium chloride flush  3 mL Intravenous Q12H      ASSESSMENT AND PLAN: Principal Problem:   ST elevation myocardial infarction (STEMI) of inferolateral wall, initial episode of care ([place]) - ECG is abnormal with inferior ST elevation - however, ez neg MI - emergent cath w/ non-obs dz. - echo pending, pt reports possible Ao (?root) aneurysm, also has infrarenal aneurysm - has appt w/ cards in [facility] later this month - if echo not acute, pt sx have resolved, ?d/c later today.   Active Problems:   Precordial chest pain - pt will need copy of his ECG    Dyslipidemia - elevated LDL - started on high-dose statin, MD advise on decreasing to 40 mg qd    Abnl TSH - ck Free T4   Signed, John Norris , [name] 8:30 AM 12/19/2015  No chest pain Echo still pending. Cors clear Dr John Norris worried about cardiac sillouette but CXR normal  With no CE.  Keep in hospital f/u ECG in am  No rub on exam Etiology of ST elevation not clear  John Norris

## 2015-12-20 ENCOUNTER — Encounter (HOSPITAL_COMMUNITY): Payer: Self-pay | Admitting: Cardiology

## 2015-12-20 DIAGNOSIS — R9431 Abnormal electrocardiogram [ECG] [EKG]: Secondary | ICD-10-CM

## 2015-12-20 DIAGNOSIS — R072 Precordial pain: Principal | ICD-10-CM

## 2015-12-20 MED ORDER — ATORVASTATIN CALCIUM 40 MG PO TABS: 40.0000 mg | ORAL_TABLET | Freq: Every day | ORAL | Status: AC

## 2015-12-20 NOTE — Discharge Summary (Signed)
Discharge Summary    Patient ID: John Norris,  MRN: [id number], DOB/AGE: [birth year] 60 y.o.  Admit date: 12/18/2015 Discharge date: 12/20/2015  Primary Care Provider: No PCP Per Patient Primary Cardiologist: previously scheduled appt with [facility] Cardiology  Discharge Diagnoses    Principal Problem:   ST elevation myocardial infarction (STEMI) of inferolateral wall,-- ruled out Active Problems:   Precordial chest pain   Allergies No Known Allergies  Diagnostic Studies/Procedures    Cardiac cath by Dr. Ellyn Hack 12/18/2015 Conclusion    1. Ost 2nd Diag to 2nd Diag lesion, 55% stenosed. Not likely culprit lesion for essentially resting chest pain 2. The left ventricular systolic function is normal.    No angiographic evidence of any significant CAD that would explain the patient's symptoms of chest pain.  With diffuse ST elevations on EKG associate with chest pain, cannot exclude pericarditis.  Plan: Admit to step down unit on 3 W. with standard posterior removal post cath.  Check 2-D echocardiogram to evaluate enlarged cardiac silhouette noted on fluoroscopy - consider possible pericardial effusion/recurrence.  Hold off on antihypertensives for now.    TTE 12/19/2015 LV EF: 50% - 55%  -------------------------------------------------------------------  Indications: Chest pain 786.51.  -------------------------------------------------------------------  History: Risk factors: History of abdominal aortic aneurysm.  STEMI. History of cancer. Current tobacco use.  -------------------------------------------------------------------  Study Conclusions  - Left ventricle: Trabeculated LV apex. The cavity size was normal.  Systolic function was normal. The estimated ejection fraction was  in the range of 50% to 55%. Wall motion was normal; there were no  regional wall motion abnormalities. Left ventricular diastolic  function parameters were normal.  - Atrial septum:  No defect or patent foramen ovale was identified.    __   History of Present Illness     John Norris is a 60 y.o. male long-term smoker with bladder cancer and recent possible GI bleed, who presented to [facility] emergency room at roughly 5:45 AM on 12/18/2015 after having been awoken from sleep at roughly for the morning with left-sided chest pain that radiated to the arm. She felt that it was possibly musculoskeletal, but when the symptoms progressed to become the ER. In the ER he was evaluated for a gamma was thought to be musculoskeletal pain, however his EKG was concerning for ST elevations in the inferior leads with strange morphology. There is also acute ST segments in a corneal leads. Reviewed the EKGs and there indeed roughly 2 mm to elevations in leads II, room 3 and aVF with potentially real early report changes and hyperacute T waves in leads V2-V6.   Upon arrival to [facility] at 0640 hrs., he was still having 3 out 10 pain down from initial 7 out of 10. At [facility] he received 4000 units IV heparin and was on heparin drip. He also received 324 of aspirin and was started on nitroglycerin paste.  Hospital Course     Due to initial EKG changes, patient was taken to the cath lab urgently for cardiac catheterization which showed 55% Ostial D2 lesion which is likely not responsible for his symptom. He ordered to rule out pericarditis, echocardiogram was obtained on 12/19/2015, which showed normal EF 50-55%, normal wall motion, no pericardial effusion.  Patient was seen by cardiology service in the morning of 12/20/2015, at which time he denies any further chest discomfort. He is deemed stable for discharge from cardiology perspective. He has a previously arranged follow-up with [facility] cardiology on 6/29. I will also  leave our information in case he needs to contact us. We will give him a copy of his EKG.  _____________  Discharge Vitals Blood pressure 113/69, pulse 61, temperature 97.6 F (36.4  C), temperature source Oral, resp. rate 18, height 5\' 6"  (1.676 m), weight 168 lb 6.4 oz (76.386 kg), SpO2 99 %.  Filed Weights   12/19/15 0535 12/20/15 0500  Weight: 166 lb 10.7 oz (75.6 kg) 168 lb 6.4 oz (76.386 kg)    Labs & Radiologic Studies     CBC  Recent Labs  12/18/15 0545 12/19/15 0240  WBC 5.2 5.2  NEUTROABS 2.1  --   HGB 14.2 13.0  HCT 42.4 38.7*  MCV 91.6 89.0  PLT 136* [id number]*   Basic Metabolic Panel  Recent Labs  12/18/15 0545 12/19/15 0240  NA 137 136  K 4.2 4.0  CL 109 107  CO2 19* 23  GLUCOSE 61* 91  BUN 20 15  CREATININE 1.04 1.00  CALCIUM 9.2 9.2   Liver Function Tests  Recent Labs  12/18/15 0545  AST 22  ALT 13*  ALKPHOS 97  BILITOT 0.6  PROT 8.4*  ALBUMIN 4.0   Cardiac Enzymes  Recent Labs  12/18/15 0834 12/18/15 1526 12/18/15 2012  TROPONINI <0.03 <0.03 <0.03   Fasting Lipid Panel  Recent Labs  12/19/15 0240  CHOL 183  HDL 48  LDLCALC 125*  TRIG 50  CHOLHDL 3.8   Thyroid Function Tests  Recent Labs  12/18/15 0834  TSH 0.256*    Dg Chest Port 1 View  12/18/2015  CLINICAL DATA:  Initial evaluation for acute chest pain. History smoking. EXAM: PORTABLE CHEST 1 VIEW COMPARISON:  None. FINDINGS: Cardiac and mediastinal silhouettes are within normal limits. Mild tortuosity of the intrathoracic aorta noted. Lungs normally inflated. Mild elevation left hemidiaphragm. No focal infiltrate, pulmonary edema, or pleural effusion. No pneumothorax. No acute osseous abnormality. IMPRESSION: No active cardiopulmonary disease. Electronically Signed   By: Jeannine Boga M.D.   On: 12/18/2015 05:59    Disposition   Pt is being discharged home today in good condition.  Follow-up Plans & Appointments    Follow-up Information    Follow up with Glenetta Hew, MD.   Specialty:  Cardiology   Why:  please followup with [facility] cardiology as previously arranged, contact [place] [facility] require further information.    Contact  information:   [address] [place] [address] [phone]        Discharge Medications   Current Discharge Medication List    START taking these medications   Details  atorvastatin (LIPITOR) 40 MG tablet Take 1 tablet (40 mg total) by mouth daily at 6 PM. Qty: 90 tablet, Refills: 3      CONTINUE these medications which have NOT CHANGED   Details  docusate sodium (COLACE) 100 MG capsule Take 1 capsule (100 mg total) by mouth daily as needed. Qty: 30 capsule, Refills: 2           Outstanding Labs/Studies   None  Duration of Discharge Encounter   Greater than 30 minutes including physician time.  Signed, Almyra Deforest [name] 12/20/2015, 10:31 AM    Personally seen and examined. Agree with above.  ST elevation myocardial infarction (STEMI) of inferolateral wall, initial episode of care ([place]) - ECG is abnormal with inferior ST elevation - however, ez neg MI - emergent cath w/ non-obs dz. - echo reassuring, no Ao (?root) aneurysm, has infrarenal aneurysm - has appt w/  cards in [facility] later this month  Active Problems:  Precordial chest pain - pt will need copy of his ECG   Dyslipidemia - elevated LDL - started on high-dose statin, MD advise on decreasing to 40 mg qd   Abnl TSH - Free T4 ok  DC home.  Signed, Candee Furbish , MD

## 2015-12-20 NOTE — Care Management Note (Signed)
Case Management Note  Patient Details  Name: John Norris MRN: [id number] Date of Birth: [birth year]  Subjective/Objective:   Pt admitted for [facility]- Transfer from [facility]- post cath. Plan for d/c today.                  Action/Plan: CM did speak with pt in regards to disposition needs. Pt is without insurance listed and no PCP. Per pt he gets a check and will be able to pay for medications. CM did state [facility] would be cheapest for the new medication Lipitor. CM did provide pt with the application for the [facility] in [place]. Pt is to call for a hospital f/u. No further needs from CM at this time.    Expected Discharge Date:                  Expected Discharge Plan:  Home/Self Care  In-House Referral:  NA  Discharge planning Services  CM Consult, Medication Assistance, [facility]  Post Acute Care Choice:  NA Choice offered to:  NA  DME Arranged:  N/A DME Agency:  NA  HH Arranged:  NA HH Agency:  NA  Status of Service:  Completed, signed off  Medicare Important Message Given:    Date Medicare IM Given:    Medicare IM give by:    Date Additional Medicare IM Given:    Additional Medicare Important Message give by:     If discussed at [place] of Stay Meetings, dates discussed:    Additional Comments:  Bethena Roys, RN 12/20/2015, 10:44 AM

## 2015-12-20 NOTE — Progress Notes (Signed)
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 Admitted 06/17 w/ CP, ?STEMI, non-obs CAD at cath, echo ordered  SUBJECTIVE: CP has resolved. He says he was laying funny on the couch and may have had a cramp. Pt has appt with Cardiology 06/29 at [facility] to eval for aneurysm "bubble above my heart"  OBJECTIVE Filed Vitals:   12/19/15 1425 12/19/15 2009 12/20/15 0500 12/20/15 0700  BP: 124/73 132/77 113/69   Pulse: 61 76 61   Temp: 97.8 F (36.6 C) 97.8 F (36.6 C) 97.6 F (36.4 C)   TempSrc: Oral Oral Oral   Resp: 18 18 18    Height:    5\' 6"  (1.676 m)  Weight:   168 lb 6.4 oz (76.386 kg)   SpO2: 99% 97% 99%     Intake/Output Summary (Last 24 hours) at 12/20/15 1001 Last data filed at 12/20/15 0829  Gross per 24 hour  Intake   1083 ml  Output    240 ml  Net    843 ml   Filed Weights   12/19/15 0535 12/20/15 0500  Weight: 166 lb 10.7 oz (75.6 kg) 168 lb 6.4 oz (76.386 kg)    PHYSICAL EXAM General: Well developed, well nourished, male in no acute distress. Head: Normocephalic, atraumatic.  Neck: Supple without bruits, JVD not elevated. Lungs:  Resp regular and unlabored, slightly decreased BS bases. Heart: RRR, S1, S2, no S3, S4, or murmur; no rub. Abdomen: Soft, non-tender, non-distended, BS + x 4.  Extremities: No clubbing, cyanosis, edema. R radial cath site without ecchymosis or hematoma Neuro: Alert and oriented X 3. Moves all extremities spontaneously. Psych: Normal affect.  LABS: CBC:  Recent Labs  12/18/15 0545 12/19/15 0240  WBC 5.2 5.2  NEUTROABS 2.1  --   HGB 14.2 13.0  HCT 42.4 38.7*  MCV 91.6  89.0  PLT 136* 141*   INR:  Recent Labs  12/18/15 0545  INR 123456   Basic Metabolic Panel:  Recent Labs  12/18/15 0545 12/19/15 0240  NA 137 136  K 4.2 4.0  CL 109 107  CO2 19* 23  GLUCOSE 61* 91  BUN 20 15  CREATININE 1.04 1.00  CALCIUM 9.2 9.2   Liver Function Tests:  Recent Labs  12/18/15 0545  AST 22  ALT 13*  ALKPHOS 97  BILITOT 0.6  PROT 8.4*  ALBUMIN 4.0   Cardiac Enzymes:  Recent Labs  12/18/15 0834 12/18/15 1526 12/18/15 2012  TROPONINI <0.03 <0.03 <0.03   Fasting Lipid Panel:  Recent Labs  12/19/15 0240  CHOL 183  HDL 48  LDLCALC 125*  TRIG 50  CHOLHDL 3.8   Thyroid Function Tests:  Recent Labs  12/18/15 0834  TSH 0.256*   TELE:  SR, S brady w/ occ HR high 40s, no sx    Personally viewed.   ECath: 06/17 1.  Ost 2nd Diag to 2nd Diag lesion, 55% stenosed. Not likely culprit lesion for essentially resting chest pain 2. The left ventricular systolic function is normal.  No angiographic evidence of any significant CAD that would explain the patient's symptoms of chest pain.  With diffuse ST elevations on  EKG associate with chest pain, cannot exclude pericarditis.  Plan: Admit to step down unit on 3 W. with standard posterior removal post cath.  Check 2-D echocardiogram to evaluate enlarged cardiac silhouette noted on fluoroscopy - consider possible pericardial effusion/recurrence. Hold off on antihypertensives for now.  Radiology/Studies: No results found.   Current Medications:  . atorvastatin  80 mg Oral q1800  . enoxaparin (LOVENOX) injection  40 mg Subcutaneous Q24H  . sodium chloride flush  3 mL Intravenous Q12H       ECHO: 12/19/15 - Left ventricle: Trabeculated LV apex. The cavity size was normal.  Systolic function was normal. The estimated ejection fraction was  in the range of 50% to 55%. Wall motion was normal; there were no  regional wall motion abnormalities. Left ventricular diastolic  function  parameters were normal. - Atrial septum: No defect or patent foramen ovale was identified. - No effusion  ASSESSMENT AND PLAN: Principal Problem:   ST elevation myocardial infarction (STEMI) of inferolateral wall, initial episode of care ([place]) - ECG is abnormal with inferior ST elevation - however, ez neg MI - emergent cath w/ non-obs dz. - echo reassuring, no Ao (?root) aneurysm,  has infrarenal aneurysm - has appt w/ cards in [facility] later this month  Active Problems:   Precordial chest pain - pt will need copy of his ECG    Dyslipidemia - elevated LDL - started on high-dose statin, MD advise on decreasing to 40 mg qd    Abnl TSH -  Free T4 ok  DC home.  Signed, Candee Furbish , MD  10:01 AM 12/20/2015

## 2016-05-13 IMAGING — CT CT ABD-PELV W/ CM
2 of 5 series · 15 of 46 positions shown, 17 images · IV contrast (iopamidol)
Comparison: CT dated 08/12/2009

CLINICAL DATA: 60-year-old male with rectal bleeding. History of
bladder cancer.

EXAM:
CT ABDOMEN AND PELVIS WITH CONTRAST
TECHNIQUE: Multidetector CT imaging of the abdomen and pelvis was performed
using the standard protocol following bolus administration of
intravenous contrast.
CONTRAST:  100mL I5ZKZJ-H77 IOPAMIDOL (I5ZKZJ-H77) INJECTION 61%

[Series 2: routine abd pel with · axial · 0.69mm/px · z∈[-520,-74]mm · 12 of 101 slices shown, 14 images]
[im 6/101  soft-tissue]
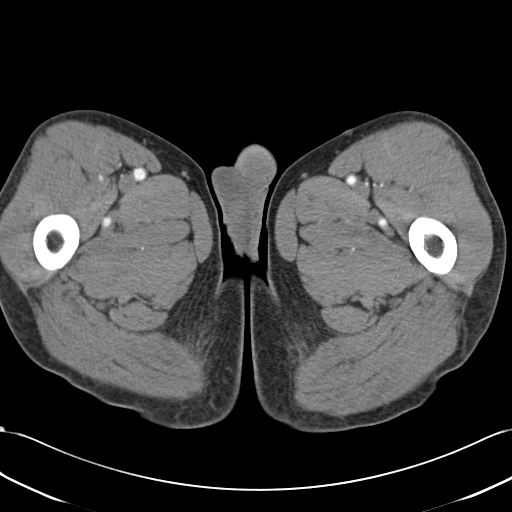
[im 6/101  bone]
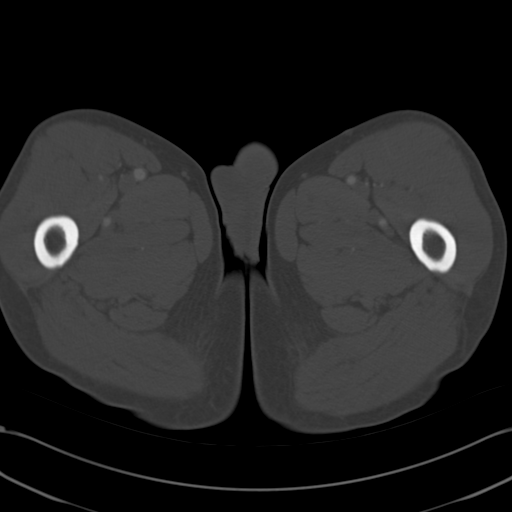
[im 16/101  soft-tissue]
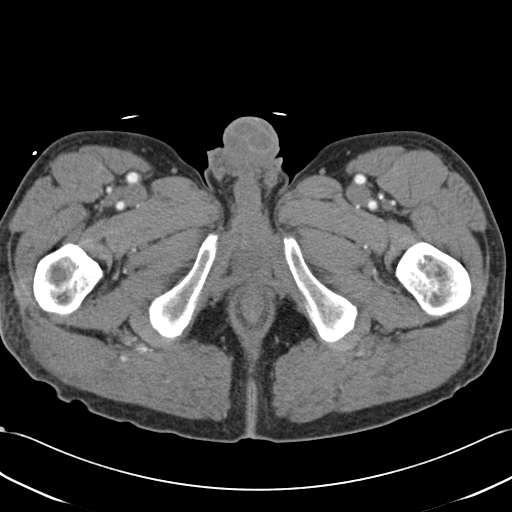
[im 22/101  soft-tissue]
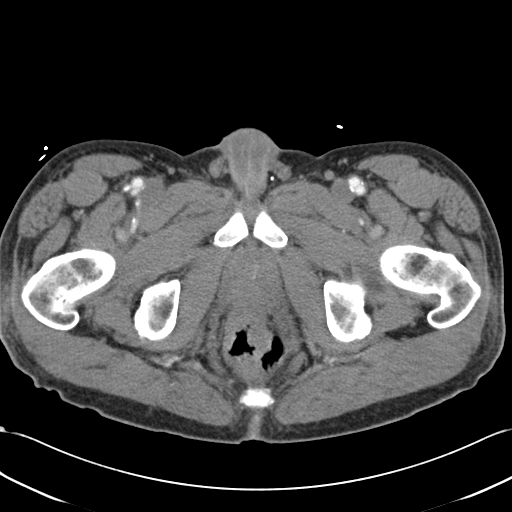
[im 32/101  soft-tissue]
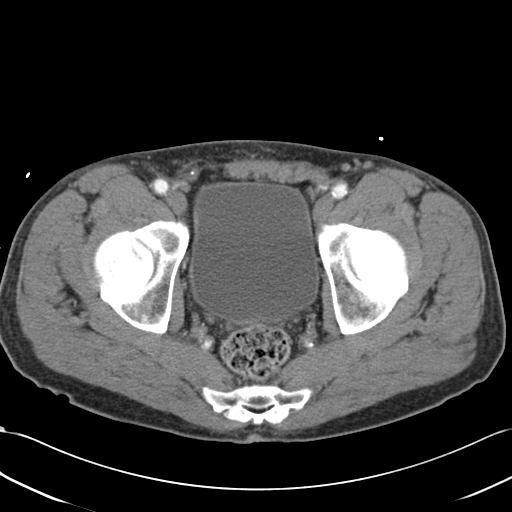
[im 37/101  soft-tissue]
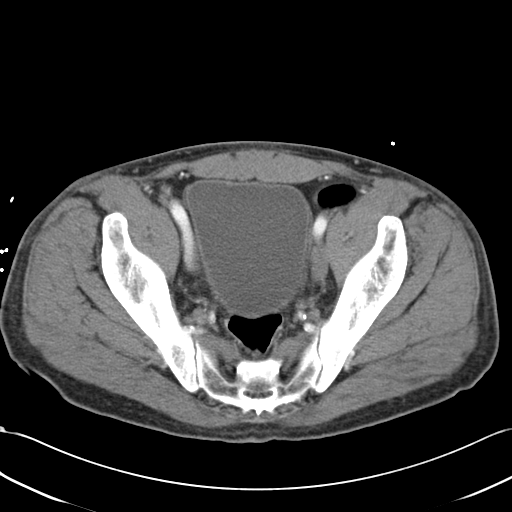
[im 48/101  soft-tissue]
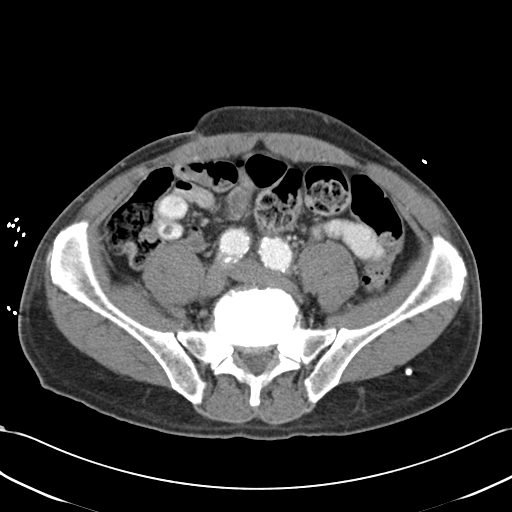
[im 53/101  soft-tissue]
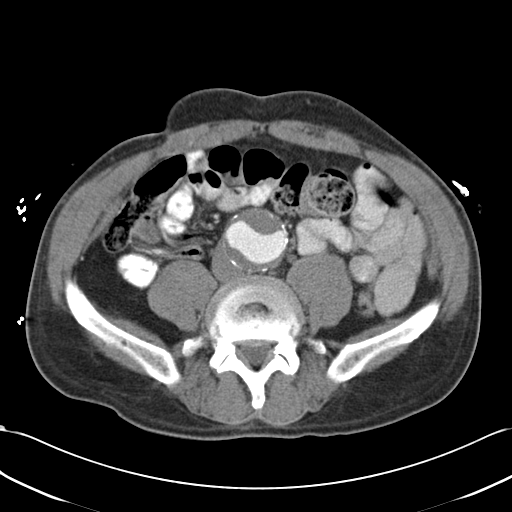
[im 64/101  soft-tissue]
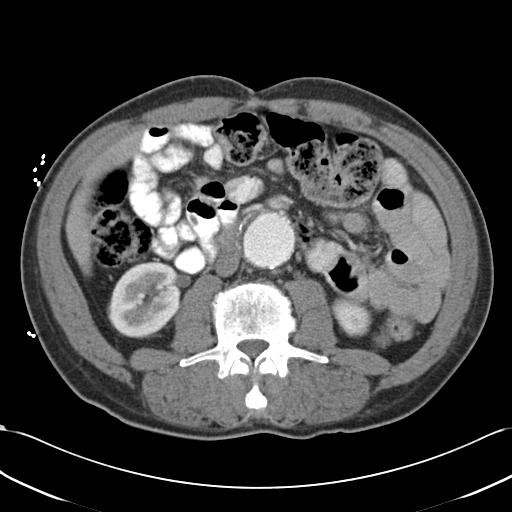
[im 69/101  soft-tissue]
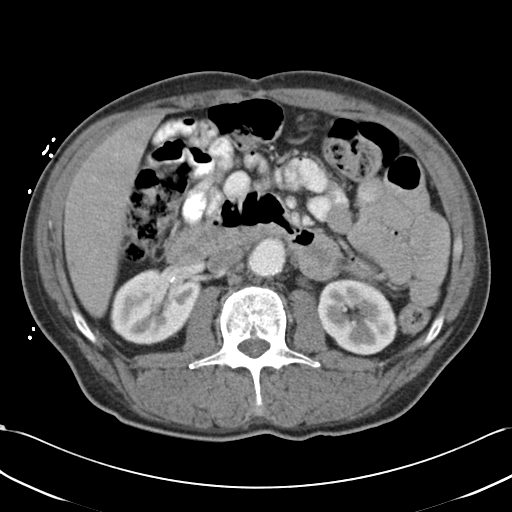
[im 69/101  bone]
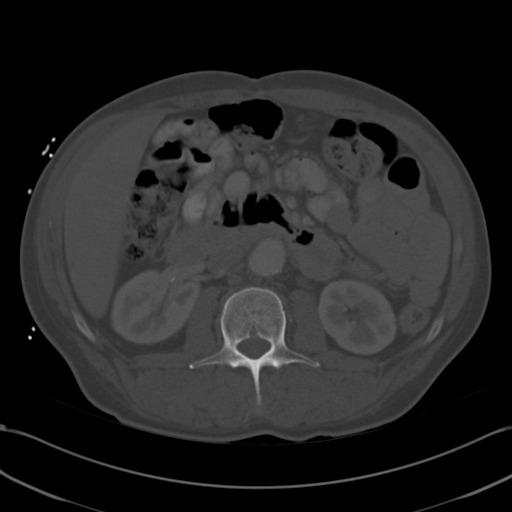
[im 79/101  soft-tissue]
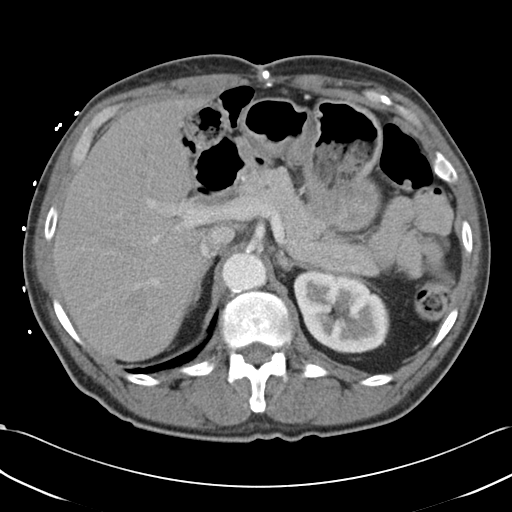
[im 85/101  soft-tissue]
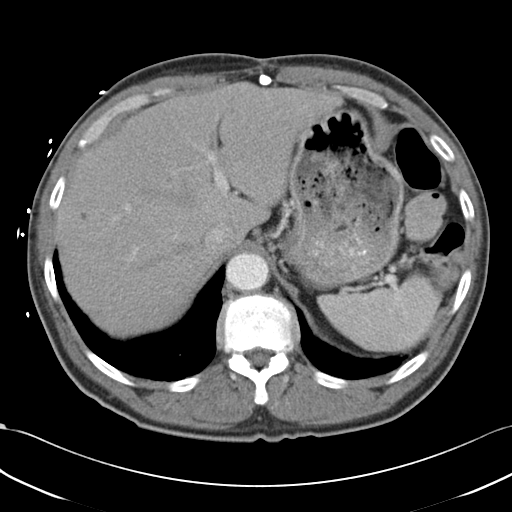
[im 95/101  soft-tissue]
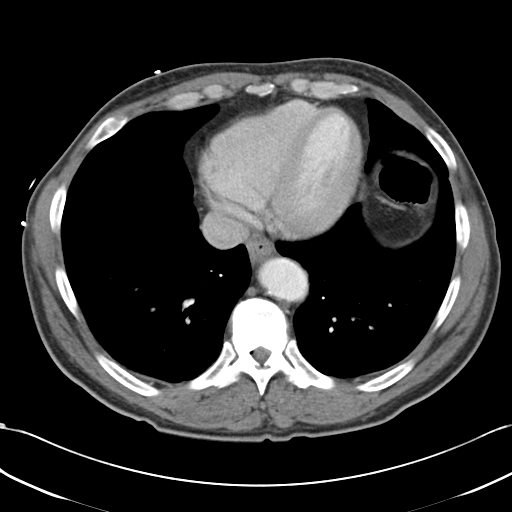

[Series 5: cor routine abd pel with · coronal · 0.67mm/px · 3 of 126 slices shown]
[im 42/126  soft-tissue]
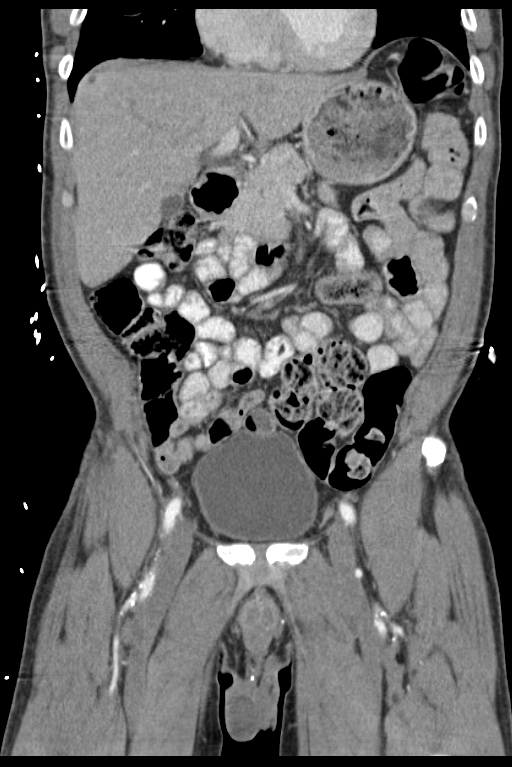
[im 56/126  soft-tissue]
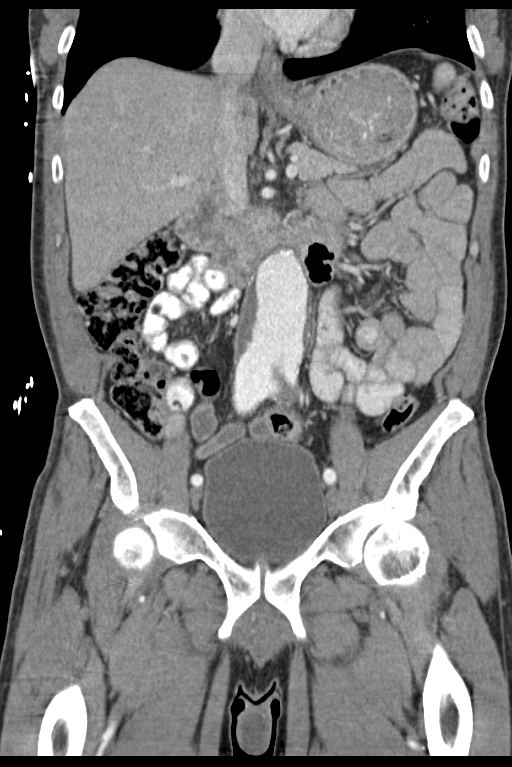
[im 70/126  soft-tissue]
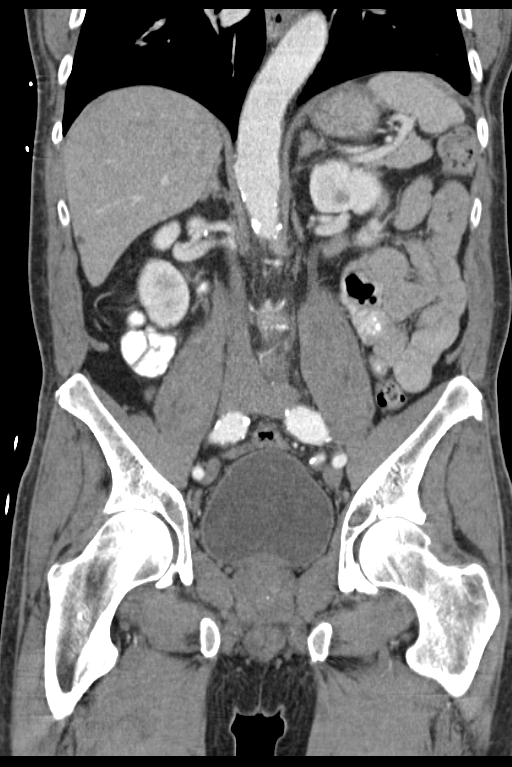

[15 of 46 positions shown; findings below may reference images not displayed]

FINDINGS: Minimal bibasilar linear atelectasis/ scarring. The visualized lung
bases are otherwise clear. No intra-abdominal free air or free
fluid.

Set multiple small scattered hepatic hypodense lesions measuring up
to 12 mm in the left lobe of the liver. These lesions are
indeterminate but demonstrate fluid attenuation and most likely
represent cysts or hemangioma. The gallbladder is contracted. The
pancreas, spleen, adrenal glands, kidneys, visualized ureters, and
urinary bladder appear unremarkable. The prostate gland is within
normal limits by size criteria and measures approximately 4 cm in
transverse axial dimension.

Moderate stool noted throughout the colon. There is no evidence of
bowel obstruction or active inflammation. Normal appendix.

There is moderate aortoiliac atherosclerotic disease. There is a
partially thrombosed fusiform infrarenal abdominal aortic aneurysm
small measuring approximately 5 cm in greatest axial dimension. This
aneurysm extends from approximately 3 cm distal to the origins of
the renal arteries into the common iliac arteries and small measures
approximately 10.5 cm in the craniocaudal length. There is no
periaortic stranding or inflammatory changes. No evidence of
contrast extravasation. The aorta measured up to 3.3 cm in greatest
axial dimension on the prior CT dated 08/12/2009. Bilateral common
iliac artery aneurysms measuring 2.3 cm. The origins of the celiac
axis, SMA, and the IMA appear patent. The origins of the renal
arteries are patent. No portal venous gas identified. There is no
adenopathy.

Multiple anterior abdominal wall incisional scar noted. No fluid
collection. There is degenerative changes of the spine. No acute
fracture.
IMPRESSION: Moderate stool throughout the colon. No evidence of bowel
obstruction or active inflammation. Normal appendix.

A 5 cm partially thrombosed infrarenal abdominal aortic aneurysm,
new from prior study. Recommend followup by abdomen and pelvis CTA
in 3-6 months, and vascular surgery referral/consultation if not
already obtained. This recommendation follows ACR consensus
guidelines: White Paper of the ACR Incidental Findings Committee II
on Vascular Findings. [HOSPITAL] 4214; [DATE].

Bilateral common iliac artery aneurysm.

## 2016-07-28 ENCOUNTER — Other Ambulatory Visit: Payer: Self-pay

## 2016-11-01 ENCOUNTER — Ambulatory Visit: Payer: Self-pay | Admitting: Internal Medicine

## 2016-11-24 ENCOUNTER — Emergency Department
Admission: EM | Admit: 2016-11-24 | Discharge: 2016-11-24 | Disposition: A | Discharge status: Home / Self Care | Payer: Medicaid Other | Attending: Emergency Medicine | Admitting: Emergency Medicine

## 2016-11-24 DIAGNOSIS — Y929 Unspecified place or not applicable: Secondary | ICD-10-CM | POA: Insufficient documentation

## 2016-11-24 DIAGNOSIS — Z8551 Personal history of malignant neoplasm of bladder: Secondary | ICD-10-CM | POA: Insufficient documentation

## 2016-11-24 DIAGNOSIS — Y939 Activity, unspecified: Secondary | ICD-10-CM | POA: Insufficient documentation

## 2016-11-24 DIAGNOSIS — I252 Old myocardial infarction: Secondary | ICD-10-CM | POA: Insufficient documentation

## 2016-11-24 DIAGNOSIS — W57XXXA Bitten or stung by nonvenomous insect and other nonvenomous arthropods, initial encounter: Secondary | ICD-10-CM | POA: Diagnosis not present

## 2016-11-24 DIAGNOSIS — Y999 Unspecified external cause status: Secondary | ICD-10-CM | POA: Diagnosis not present

## 2016-11-24 DIAGNOSIS — F17200 Nicotine dependence, unspecified, uncomplicated: Secondary | ICD-10-CM | POA: Insufficient documentation

## 2016-11-24 DIAGNOSIS — S80861A Insect bite (nonvenomous), right lower leg, initial encounter: Secondary | ICD-10-CM | POA: Diagnosis present

## 2016-11-24 MED ORDER — DOXYCYCLINE HYCLATE 100 MG PO TABS
ORAL_TABLET | ORAL | Status: AC
  Administered 2016-11-24: 100 mg
  Filled 2016-11-24: qty 1

## 2016-11-24 MED ORDER — DOXYCYCLINE HYCLATE 100 MG PO TABS: 100.0000 mg | ORAL_TABLET | Freq: Two times a day (BID) | ORAL | 0 refills | Status: DC

## 2016-11-24 NOTE — ED Notes (Signed)
100mg  Doxy PO verbal order

## 2016-11-24 NOTE — ED Notes (Signed)
Dr Kinner at bedside. 

## 2016-11-24 NOTE — ED Notes (Signed)
Pt states that he has a bite on his leg (right back of calf). He noticed it today. Says he didn't really feel it because he has " no nerves in that leg". Says when he walks it feels like that leg gets weak. Location is red looking.

## 2016-11-24 NOTE — ED Provider Notes (Signed)
[facility] Emergency Department Provider Note   ____________________________________________    I have reviewed the triage vital signs and the nursing notes.   HISTORY  Chief Complaint Insect bite    HPI John Norris is a 61 y.o. male who presents with an insect bite to the right lower leg which he reports has developed redness around it. He is not sure what type of insect it was. He does work outside says it could've been a tick. No fevers or chills, otherwise feels well   Past Medical History:  Diagnosis Date  . Abdominal aortic aneurysm (AAA) without rupture (HCC)    Roughly 5 cm partially thrombotic. Also, treated by bilateral iliac aneurysms  . Bladder cancer ([place])   . Chest pain    74mm ST elevation in inferior lead, cath 12/18/2015 55% ost D1 stenosis, otherwise clean coronary, no culprit lesion found. Echo normal EF.  . GI bleed May 2017    Patient Active Problem List   Diagnosis Date Noted  . ST elevation myocardial infarction (STEMI) of inferolateral wall,-- ruled out 12/18/2015  . Abdominal aortic aneurysm rupture (HCC) 5.3 cm, partially thrombosed 12/18/2015  . Precordial chest pain 12/18/2015  . Ischemic chest pain   . ST elevation myocardial infarction (STEMI) [facility])     Past Surgical History:  Procedure Laterality Date  . CARDIAC CATHETERIZATION N/A 12/18/2015   Procedure: Left Heart Cath and Coronary Angiography;  Surgeon: Leonie Man, MD;  Location: [place] CV LAB;  Service: Cardiovascular;  Laterality: N/A;  . Exploratory abdominal surgery     for what was possibly appendicitis versus tumor    Prior to Admission medications   Medication Sig Start Date End Date Taking? Authorizing Provider  atorvastatin (LIPITOR) 40 MG tablet Take 1 tablet (40 mg total) by mouth daily at 6 PM. 12/20/15   Almyra Deforest, PA  doxycycline (VIBRA-TABS) 100 MG tablet Take 1 tablet (100 mg total) by mouth 2 (two) times daily. 11/24/16    Lavonia Drafts, MD     Allergies Patient has no known allergies.  Family History  Problem Relation Age of Onset  . Prostate cancer Father   . Bone cancer Father        metastasis    Social History Social History  Substance Use Topics  . Smoking status: Current Every Day Smoker  . Smokeless tobacco: Never Used  . Alcohol use Yes    Review of Systems  Constitutional: No fever/chills     Gastrointestinal:   No nausea, no vomiting.   Genitourinary: Musculoskeletal: Leg pain Skin: As above     ____________________________________________   PHYSICAL EXAM:  VITAL SIGNS: ED Triage Vitals  Enc Vitals Group     BP 11/24/16 2056 128/84     Pulse Rate 11/24/16 2056 (!) 102     Resp 11/24/16 2056 18     Temp 11/24/16 2056 98.6 F (37 C)     Temp src --      SpO2 --      Weight --      Height --      Head Circumference --      Peak Flow --      Pain Score 11/24/16 2055 0     Pain Loc --      Pain Edu? --      Excl. in [place]? --      Constitutional: Alert and oriented. No acute distress. Pleasant and interactive Eyes: Conjunctivae are normal.  Nose: No congestion/rhinnorhea. Mouth/Throat: Mucous membranes are moist.   Cardiovascular: Normal rate, regular rhythm.   Musculoskeletal: No lower extremity tenderness nor edema.   Neurologic:  Normal speech and language. No gross focal neurologic deficits are appreciated.   Skin:  Skin is warm, dry and intact. Apparent insect bite posterior right distal lower leg with approximately 2 cm of surrounding erythema, questionable target lesion, more likely cellulitis   ____________________________________________   LABS (all labs ordered are listed, but only abnormal results are displayed)  Labs Reviewed - No data to  display ____________________________________________  EKG   ____________________________________________  RADIOLOGY  None ____________________________________________   PROCEDURES  Procedure(s) performed: No    Critical Care performed: No ____________________________________________   INITIAL IMPRESSION / ASSESSMENT AND PLAN / ED COURSE  Pertinent labs & imaging results that were available during my care of the patient were reviewed by me and considered in my medical decision making (see chart for details).  Cellulitis versus target lesion/lyme, we'll treat with doxycycline by mouth   ____________________________________________   FINAL CLINICAL IMPRESSION(S) / ED DIAGNOSES  Final diagnoses:  Bug bite with infection, initial encounter      NEW MEDICATIONS STARTED DURING THIS VISIT:  Discharge Medication List as of 11/24/2016  9:20 PM    START taking these medications   Details  doxycycline (VIBRA-TABS) 100 MG tablet Take 1 tablet (100 mg total) by mouth 2 (two) times daily., Starting Fri 11/24/2016, Print         Note:  This document was prepared using Dragon voice recognition software and may include unintentional dictation errors.    Lavonia Drafts, MD 11/24/16 2149

## 2016-11-24 NOTE — ED Notes (Signed)
Pt signed E signature for discharge. Signature did not show up on pad

## 2017-05-09 ENCOUNTER — Other Ambulatory Visit: Payer: Self-pay

## 2017-05-09 ENCOUNTER — Emergency Department
Admission: EM | Admit: 2017-05-09 | Discharge: 2017-05-09 | Disposition: A | Discharge status: Home / Self Care | Payer: Medicaid Other | Attending: Emergency Medicine | Admitting: Emergency Medicine

## 2017-05-09 DIAGNOSIS — F17200 Nicotine dependence, unspecified, uncomplicated: Secondary | ICD-10-CM | POA: Insufficient documentation

## 2017-05-09 DIAGNOSIS — N529 Male erectile dysfunction, unspecified: Secondary | ICD-10-CM | POA: Diagnosis not present

## 2017-05-09 NOTE — ED Provider Notes (Signed)
[facility] Emergency Department Provider Note   ____________________________________________   First MD Initiated Contact with Patient 05/09/17 1008     (approximate)  I have reviewed the triage vital signs and the nursing notes.   HISTORY  Chief Complaint No chief complaint on file.    HPI John Norris is a 61 y.o. male patient concern with erectile dysfunction which occurred after having surgery for an abdominal aneurysm 4 months ago. Patient states since the surgery has been unable to maintain erection or ejaculation. Patient stated talked his surgeon with this complaint was not satisfied with this answer. Patient requesting second opinion and evaluation.   Past Medical History:  Diagnosis Date  . Abdominal aortic aneurysm (AAA) without rupture (HCC)    Roughly 5 cm partially thrombotic. Also, treated by bilateral iliac aneurysms  . Bladder cancer ([place])   . Chest pain    23mm ST elevation in inferior lead, cath 12/18/2015 55% ost D1 stenosis, otherwise clean coronary, no culprit lesion found. Echo normal EF.  . GI bleed May 2017    Patient Active Problem List   Diagnosis Date Noted  . ST elevation myocardial infarction (STEMI) of inferolateral wall,-- ruled out 12/18/2015  . Abdominal aortic aneurysm rupture (HCC) 5.3 cm, partially thrombosed 12/18/2015  . Precordial chest pain 12/18/2015  . Ischemic chest pain   . ST elevation myocardial infarction (STEMI) [facility])     Past Surgical History:  Procedure Laterality Date  . Exploratory abdominal surgery     for what was possibly appendicitis versus tumor    Prior to Admission medications   Medication Sig Start Date End Date Taking? Authorizing Provider  atorvastatin (LIPITOR) 40 MG tablet Take 1 tablet (40 mg total) by mouth daily at 6 PM. 12/20/15   Almyra Deforest, PA  doxycycline (VIBRA-TABS) 100 MG tablet Take 1 tablet (100 mg total) by mouth 2 (two) times daily. 11/24/16   Lavonia Drafts, MD      Allergies Patient has no known allergies.  Family History  Problem Relation Age of Onset  . Prostate cancer Father   . Bone cancer Father        metastasis    Social History Social History   Tobacco Use  . Smoking status: Current Every Day Smoker  . Smokeless tobacco: Never Used  Substance Use Topics  . Alcohol use: Yes  . Drug use: No    Review of Systems Constitutional: No fever/chills Eyes: No visual changes. ENT: No sore throat. Cardiovascular: Denies chest pain. Respiratory: Denies shortness of breath. Gastrointestinal: No abdominal pain.  No nausea, no vomiting.  No diarrhea.  No constipation. Genitourinary: Negative for dysuria. Erectile dysfunction Musculoskeletal: Negative for back pain. Skin: Negative for rash. Neurological: Negative for headaches, focal weakness or numbness.   ____________________________________________   PHYSICAL EXAM:  VITAL SIGNS: ED Triage Vitals  Enc Vitals Group     BP 05/09/17 0943 (!) 149/85     Pulse Rate 05/09/17 0943 75     Resp 05/09/17 0943 18     Temp 05/09/17 0943 97.8 F (36.6 C)     Temp Source 05/09/17 0943 Oral     SpO2 05/09/17 0943 97 %     Weight 05/09/17 0944 180 lb (81.6 kg)     Height 05/09/17 0944 5\' 6"  (1.676 m)     Head Circumference --      Peak Flow --      Pain Score --      Pain Loc --  Pain Edu? --      Excl. in [place]? --    Constitutional: Alert and oriented. Well appearing and in no acute distress. Cardiovascular: Normal rate, regular rhythm. Grossly normal heart sounds.  Good peripheral circulation. Respiratory: Normal respiratory effort.  No retractions. Lungs CTAB. Gastrointestinal: Soft and nontender. No distention. No abdominal bruits. No CVA tenderness. Musculoskeletal: No lower extremity tenderness nor edema.  No joint effusions. Neurologic:  Normal speech and language. No gross focal neurologic deficits are appreciated. No gait instability. Skin:  Skin is warm, dry and  intact. No rash noted. Psychiatric: Mood and affect are normal. Speech and behavior are normal.  ____________________________________________   LABS (all labs ordered are listed, but only abnormal results are displayed)  Labs Reviewed - No data to display ____________________________________________  EKG   ____________________________________________  RADIOLOGY  No results found.  ____________________________________________   PROCEDURES  Procedure(s) performed: None  Procedures  Critical Care performed: No  ____________________________________________   INITIAL IMPRESSION / ASSESSMENT AND PLAN / ED COURSE  As part of my medical decision making, I reviewed the following data within the electronic MEDICAL RECORD NUMBER    Erectile dysfunction. Patient given discharge care instructions and advised to follow with urology for definitive evaluation and treatment.    ____________________________________________   FINAL CLINICAL IMPRESSION(S) / ED DIAGNOSES  Final diagnoses:  Erectile dysfunction, unspecified erectile dysfunction type     ED Discharge Orders    None       Note:  This document was prepared using Dragon voice recognition software and may include unintentional dictation errors.    Sable Feil, [name] 05/09/17 1014    Earleen Newport, MD 05/09/17 1017

## 2017-05-09 NOTE — ED Triage Notes (Signed)
Pt had surgery for abdominal aneurysm in June, states he has been unable to ejaculate. Wants to talk to Dr here about his concerns. Denies pain at this time.

## 2017-05-16 ENCOUNTER — Ambulatory Visit: Payer: Self-pay | Admitting: Urology

## 2017-05-30 ENCOUNTER — Encounter: Payer: Self-pay | Admitting: Urology

## 2017-05-30 ENCOUNTER — Ambulatory Visit (INDEPENDENT_AMBULATORY_CARE_PROVIDER_SITE_OTHER): Payer: Medicaid Other | Admitting: Urology

## 2017-05-30 VITALS — BP 129/81 | HR 83 | Ht 66.0 in | Wt 168.8 lb

## 2017-05-30 DIAGNOSIS — N5319 Other ejaculatory dysfunction: Secondary | ICD-10-CM

## 2017-05-30 NOTE — Progress Notes (Signed)
05/30/2017 12:20 PM   John Norris 01-Jan-1956 [id number]  Referring provider: No referring provider defined for this encounter.  Chief Complaint  Patient presents with  . Erectile Dysfunction    HPI: John Norris is a 61 year old male who presents for evaluation of ejaculatory dysfunction.  He states he had an open AAA repair in September 2017.  He noted after the procedure he was able to achieve orgasm but had a loss of seminal emission.  He saw his vascular surgeon 1 month postop complaining of a right foot drop and loss of ejaculation.  He was referred to neurosurgery and diagnosed with a lumbar radiculitis.  An MRI was ordered however denied until he underwent physical therapy.  He states he is able to achieve an erection firm enough for penetration and denies erectile dysfunction.  He loses the erection after orgasm but has loss of seminal emission.  There is no history of diabetes.  Past urologic history remarkable for urothelial carcinoma.  He saw [facility] in 2017 for hematospermia.  Cystoscopy was performed August 2017 which showed no evidence of recurrent bladder tumor.  He states he is scheduled for follow-up at [facility] in the near future.  He presently has no voiding complaints.  Denies dysuria or gross hematuria.   PMH: Past Medical History:  Diagnosis Date  . Abdominal aortic aneurysm (AAA) without rupture (HCC)    Roughly 5 cm partially thrombotic. Also, treated by bilateral iliac aneurysms  . Bladder cancer ([place])   . Chest pain    39mm ST elevation in inferior lead, cath 12/18/2015 55% ost D1 stenosis, otherwise clean coronary, no culprit lesion found. Echo normal EF.  . GI bleed May 2017    Surgical History: Past Surgical History:  Procedure Laterality Date  . CARDIAC CATHETERIZATION N/A 12/18/2015   Procedure: Left Heart Cath and Coronary Angiography;  Surgeon: Leonie Man, MD;  Location: [place] CV LAB;  Service: Cardiovascular;  Laterality: N/A;  .  Exploratory abdominal surgery     for what was possibly appendicitis versus tumor    Home Medications:  Allergies as of 05/30/2017   No Known Allergies     Medication List        Accurate as of 05/30/17 12:20 PM. Always use your most recent med list.          acetaminophen-codeine 300-30 MG tablet Commonly known as:  TYLENOL #3 take 1 tablet by mouth every 4 to 6 hours if needed for pain   aspirin EC 81 MG tablet Take 81 mg by mouth daily.   atorvastatin 40 MG tablet Commonly known as:  LIPITOR Take 1 tablet (40 mg total) by mouth daily at 6 PM.       Allergies: No Known Allergies  Family History: Family History  Problem Relation Age of Onset  . Prostate cancer Father   . Bone cancer Father        metastasis  . Bladder Cancer Neg Hx   . Kidney cancer Neg Hx     Social History:  reports that he has been smoking.  he has never used smokeless tobacco. He reports that he drinks alcohol. He reports that he does not use drugs.  ROS: UROLOGY Frequent Urination?: No Hard to postpone urination?: No Burning/pain with urination?: No Get up at night to urinate?: No Leakage of urine?: No Urine stream starts and stops?: No Trouble starting stream?: No Do you have to strain to urinate?: No Blood in urine?: No Urinary  tract infection?: No Sexually transmitted disease?: No Injury to kidneys or bladder?: No Painful intercourse?: No Weak stream?: No Erection problems?: Yes Penile pain?: No  Gastrointestinal Nausea?: No Vomiting?: No Indigestion/heartburn?: No Diarrhea?: No Constipation?: Yes  Constitutional Fever: No Night sweats?: No Weight loss?: No Fatigue?: No  Skin Skin rash/lesions?: No Itching?: No  Eyes Blurred vision?: No Double vision?: No  Ears/Nose/Throat Sore throat?: No Sinus problems?: No  Hematologic/Lymphatic Swollen glands?: No Easy bruising?: No  Cardiovascular Leg swelling?: No Chest pain?: No  Respiratory Cough?:  No Shortness of breath?: No  Endocrine Excessive thirst?: No  Musculoskeletal Back pain?: No Joint pain?: No  Neurological Headaches?: No Dizziness?: No  Psychologic Depression?: No Anxiety?: No  Physical Exam: BP 129/81 (BP Location: Right Arm, Patient Position: Sitting, Cuff Size: Normal)   Pulse 83   Ht 5\' 6"  (1.676 m)   Wt 168 lb 12.8 oz (76.6 kg)   BMI 27.25 kg/m   Constitutional:  Alert and oriented, No acute distress. HEENT: [place] AT, moist mucus membranes.  Trachea midline, no masses. Cardiovascular: No clubbing, cyanosis, or edema. Respiratory: Normal respiratory effort, no increased work of breathing. GI: Abdomen is soft, nontender, nondistended, no abdominal masses GU: No CVA tenderness.  Skin: No rashes, bruises or suspicious lesions. Lymph: No cervical or inguinal adenopathy. Neurologic: Grossly intact, no focal deficits, moving all 4 extremities. Psychiatric: Normal mood and affect.  Laboratory Data: Lab Results  Component Value Date   WBC 5.2 12/19/2015   HGB 13.0 12/19/2015   HCT 38.7 (L) 12/19/2015   MCV 89.0 12/19/2015   PLT 141 (L) 12/19/2015    Lab Results  Component Value Date   CREATININE 1.00 12/19/2015     Assessment & Plan:    1.  Ejaculatory dysfunction Potential etiologies were discussed including loss of semimal emission due to sympathetic nerve injury during retroperitoneal surgery.  Loss of ejaculation has also been described with lumbosacral disc disease.  He was informed if there are no effective treatments for this condition.  He was also informed that the only issue with loss of ejaculation is infertility and he is currently not interested in having additional children.   Return if symptoms worsen or fail to improve.    Abbie Sons, [place] [address], [place], [place] [address] [phone]

## 2017-06-03 DIAGNOSIS — N5319 Other ejaculatory dysfunction: Secondary | ICD-10-CM | POA: Insufficient documentation

## 2021-10-22 ENCOUNTER — Emergency Department
Admission: EM | Admit: 2021-10-22 | Discharge: 2021-10-22 | Disposition: A | Discharge status: Home / Self Care | Payer: Medicare Other | Attending: Emergency Medicine | Admitting: Emergency Medicine

## 2021-10-22 ENCOUNTER — Emergency Department: Payer: Medicare Other

## 2021-10-22 ENCOUNTER — Other Ambulatory Visit: Payer: Self-pay

## 2021-10-22 ENCOUNTER — Encounter: Payer: Self-pay | Admitting: Emergency Medicine

## 2021-10-22 DIAGNOSIS — J441 Chronic obstructive pulmonary disease with (acute) exacerbation: Secondary | ICD-10-CM | POA: Insufficient documentation

## 2021-10-22 DIAGNOSIS — I2510 Atherosclerotic heart disease of native coronary artery without angina pectoris: Secondary | ICD-10-CM | POA: Diagnosis not present

## 2021-10-22 DIAGNOSIS — J40 Bronchitis, not specified as acute or chronic: Secondary | ICD-10-CM

## 2021-10-22 DIAGNOSIS — F17200 Nicotine dependence, unspecified, uncomplicated: Secondary | ICD-10-CM | POA: Diagnosis not present

## 2021-10-22 DIAGNOSIS — R0781 Pleurodynia: Secondary | ICD-10-CM | POA: Diagnosis present

## 2021-10-22 MED ORDER — ALBUTEROL SULFATE HFA 108 (90 BASE) MCG/ACT IN AERS: 2.0000 | INHALATION_SPRAY | Freq: Four times a day (QID) | RESPIRATORY_TRACT | 2 refills | Status: AC | PRN

## 2021-10-22 MED ORDER — AEROCHAMBER MV MISC: 0 refills | Status: AC

## 2021-10-22 MED ORDER — DOXYCYCLINE HYCLATE 50 MG PO CAPS: 100.0000 mg | ORAL_CAPSULE | Freq: Two times a day (BID) | ORAL | 0 refills | Status: AC

## 2021-10-22 MED ORDER — PREDNISONE 10 MG (21) PO TBPK: ORAL_TABLET | ORAL | 0 refills | Status: AC

## 2021-10-22 NOTE — ED Provider Notes (Signed)
? ?[facility] ?Provider Note ? ? Event Date/Time  ? First MD Initiated Contact with Patient 10/22/21 1651   ?  (approximate) ?History  ?Cough ? ?HPI ?John Norris is a 66 y.o. male with a stated past medical history of significant tobacco abuse, AAA rupture, and CAD who presents for persistent cough as well as recent left-sided lower anterior rib cage pain that is worse with coughing.  Patient states that he has a persistent cough for the last month but has only recently developed this left anterior chest wall pain.  Patient also endorses this 10/10, aching pain whenever he is taking a deep breath.  Patient denies any fever but does endorse productive cough of white sputum.  Patient currently denies any vision changes, tinnitus, difficulty speaking, facial droop, sore throat, chest pain, shortness of breath, abdominal pain, nausea/vomiting/diarrhea, dysuria, or weakness/numbness/paresthesias in any extremity ?Physical Exam  ?Triage Vital Signs: ?ED Triage Vitals [10/22/21 1630]  ?Enc Vitals Group  ?   BP 129/88  ?   Pulse Rate 75  ?   Resp 20  ?   Temp 98.2 ?F (36.8 ?C)  ?   Temp Source Oral  ?   SpO2 97 %  ?   Weight 180 lb (81.6 kg)  ?   Height '5\' 6"'$  (1.676 m)  ?   Head Circumference   ?   Peak Flow   ?   Pain Score 10  ?   Pain Loc   ?   Pain Edu?   ?   Excl. in [place]?   ? ?Most recent vital signs: ?Vitals:  ? 10/22/21 1630  ?BP: 129/88  ?Pulse: 75  ?Resp: 20  ?Temp: 98.2 ?F (36.8 ?C)  ?SpO2: 97%  ? ?General: Awake, oriented x4. ?CV:  Good peripheral perfusion.  ?Resp:  Normal effort.  Expiratory wheezes over bilateral lung fields ?Abd:  No distention.  ?Other:  Elderly cachectic African-American male sitting on the side of the bed in no distress ?ED Results / Procedures / Treatments  ? ?RADIOLOGY ?ED MD interpretation: 2 view chest x-ray interpreted by me shows no evidence of acute abnormalities including no pneumonia, pneumothorax, or widened mediastinum ?-Agree with radiology  assessment ?Official radiology report(s): ?DG Chest 2 View ? ?Result Date: 10/22/2021 ?CLINICAL DATA:  Productive cough and left-sided pleuritic chest pain. EXAM: CHEST - 2 VIEW COMPARISON:  12/18/2015 FINDINGS: The heart size and mediastinal contours are within normal limits. Aortic atherosclerotic calcification noted. Both lungs are clear. The visualized skeletal structures are unremarkable. IMPRESSION: No active cardiopulmonary disease. Electronically Signed   By: Marlaine Hind M.D.   On: 10/22/2021 16:57   ?PROCEDURES: ?Critical Care performed: No ?Procedures ?MEDICATIONS ORDERED IN ED: ?Medications - No data to display ?IMPRESSION / MDM / ASSESSMENT AND PLAN / ED COURSE  ?I reviewed the triage vital signs and the nursing notes. ?             ?               ?The patient appears to be suffering from a moderate exacerbation of COPD. ? ?Based on the history, exam, CXR/EKG, and further workup I don?t suspect any other emergent cause of this presentation, such as pneumonia, acute coronary syndrome, congestive heart failure, pulmonary embolism, or pneumothorax. ? ?Rx: Steroids, Antibiotics, Albuterol ?Disposition: Discharge home with SRP. PCP follow up recommended in next 48hours. ? ?  ?FINAL CLINICAL IMPRESSION(S) / ED DIAGNOSES  ? ?Final diagnoses:  ?Bronchitis  ? ?  Rx / DC Orders  ? ?ED Discharge Orders   ? ?      Ordered  ?  doxycycline (VIBRAMYCIN) 50 MG capsule  2 times daily       ? 10/22/21 1738  ?  predniSONE (STERAPRED UNI-PAK 21 TAB) 10 MG (21) TBPK tablet       ? 10/22/21 1738  ?  albuterol (VENTOLIN HFA) 108 (90 Base) MCG/ACT inhaler  Every 6 hours PRN       ? 10/22/21 1738  ?  Spacer/Aero-Holding Chambers (AEROCHAMBER MV) inhaler       ? 10/22/21 1738  ? ?  ?  ? ?  ? ?Note:  This document was prepared using Dragon voice recognition software and may include unintentional dictation errors. ?  ?Naaman Plummer, MD ?10/22/21 1757 ? ?

## 2021-10-22 NOTE — ED Triage Notes (Signed)
Pt via POV from home. Pt c/o productive cough and pain on his L side in the rib area when his coughs. Denies SOB. Denies fever. Pt is A&Ox4 and NAD ?

## 2022-04-23 IMAGING — CR DG CHEST 2V
1 series · 2 of 2 positions shown · non-contrast
Comparison: 12/18/2015

CLINICAL DATA: Productive cough and left-sided pleuritic chest
pain.

EXAM:
CHEST - 2 VIEW

[Series 1: dg chest 2 view · 0.14mm/px · 2 of 2 slices shown]
[im 1/2]
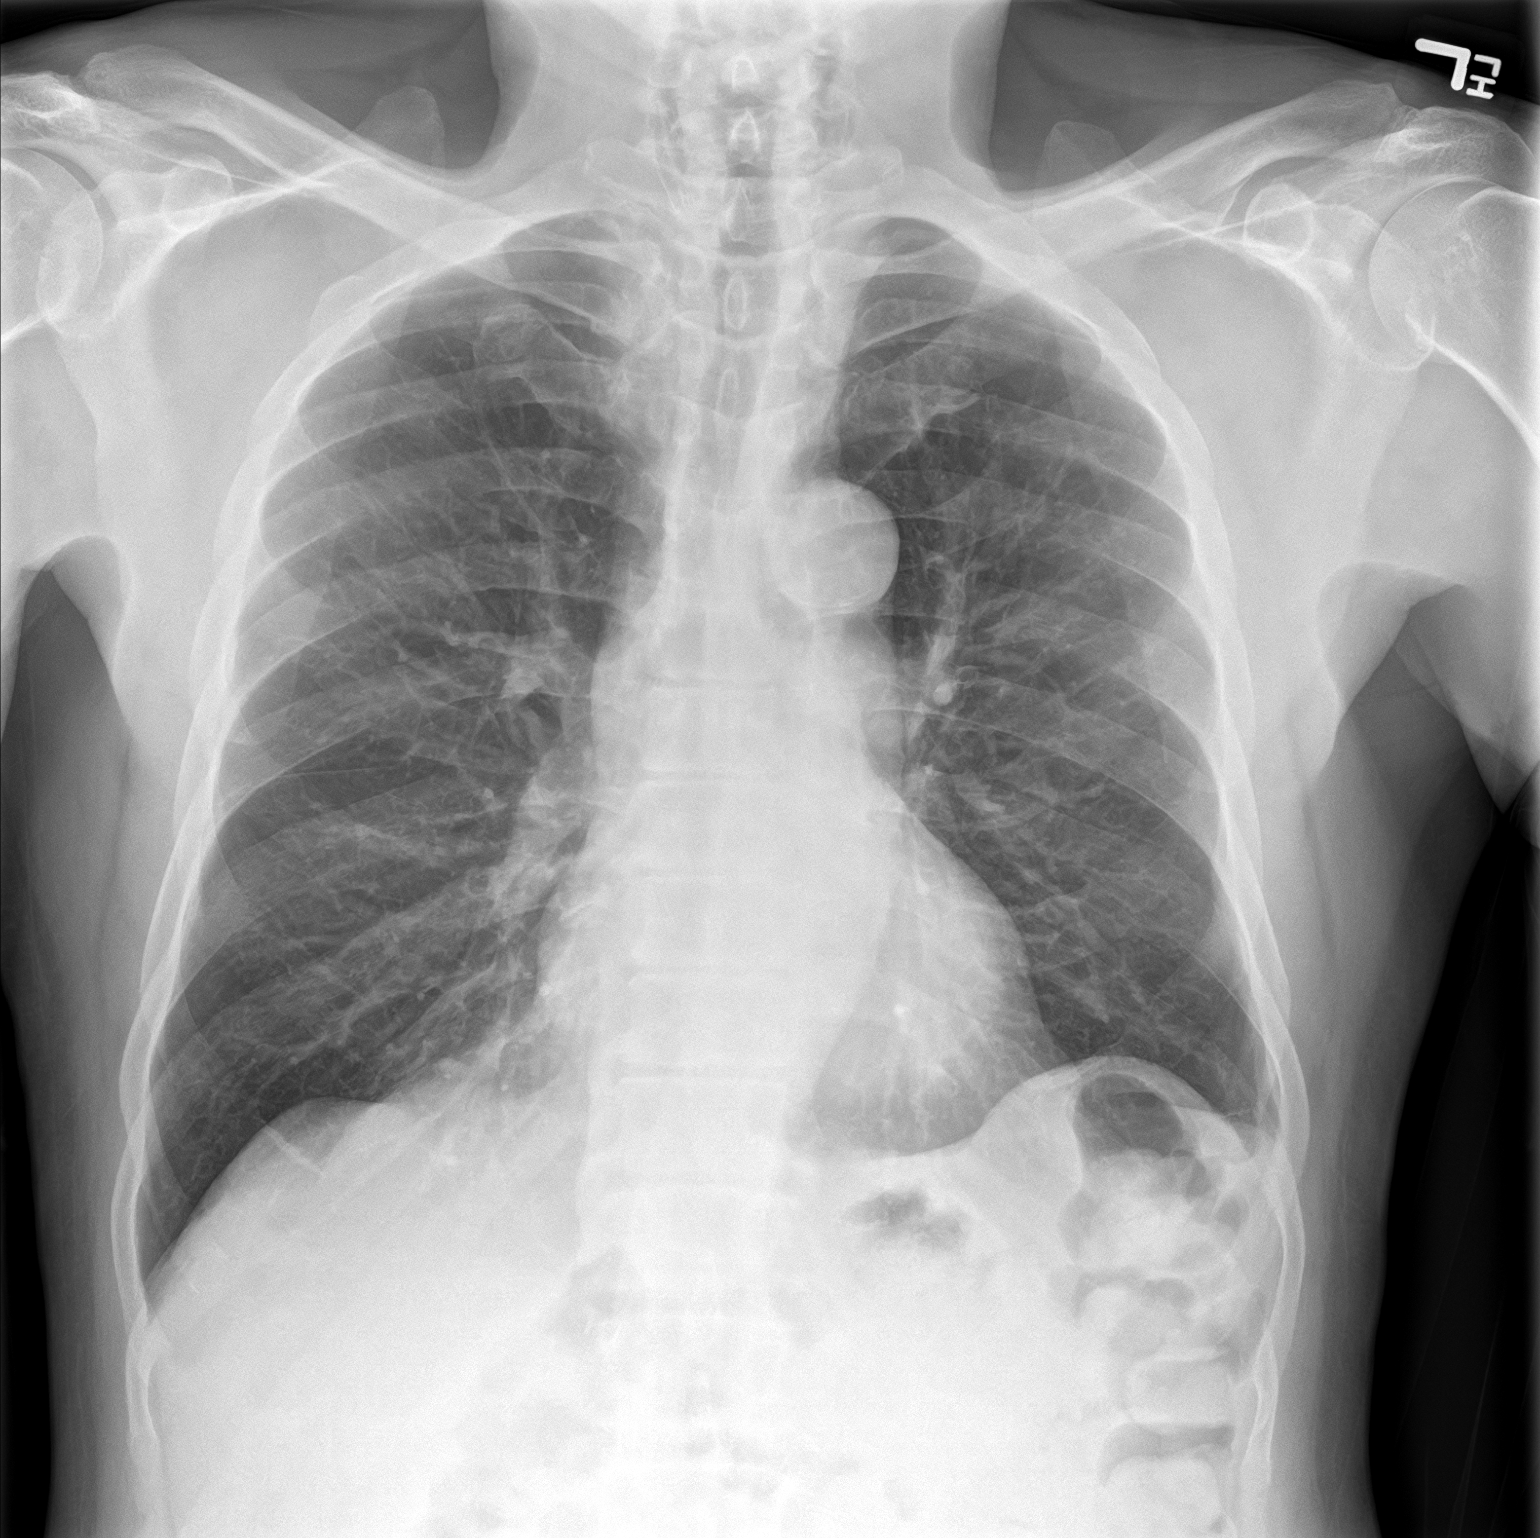
[im 2/2]
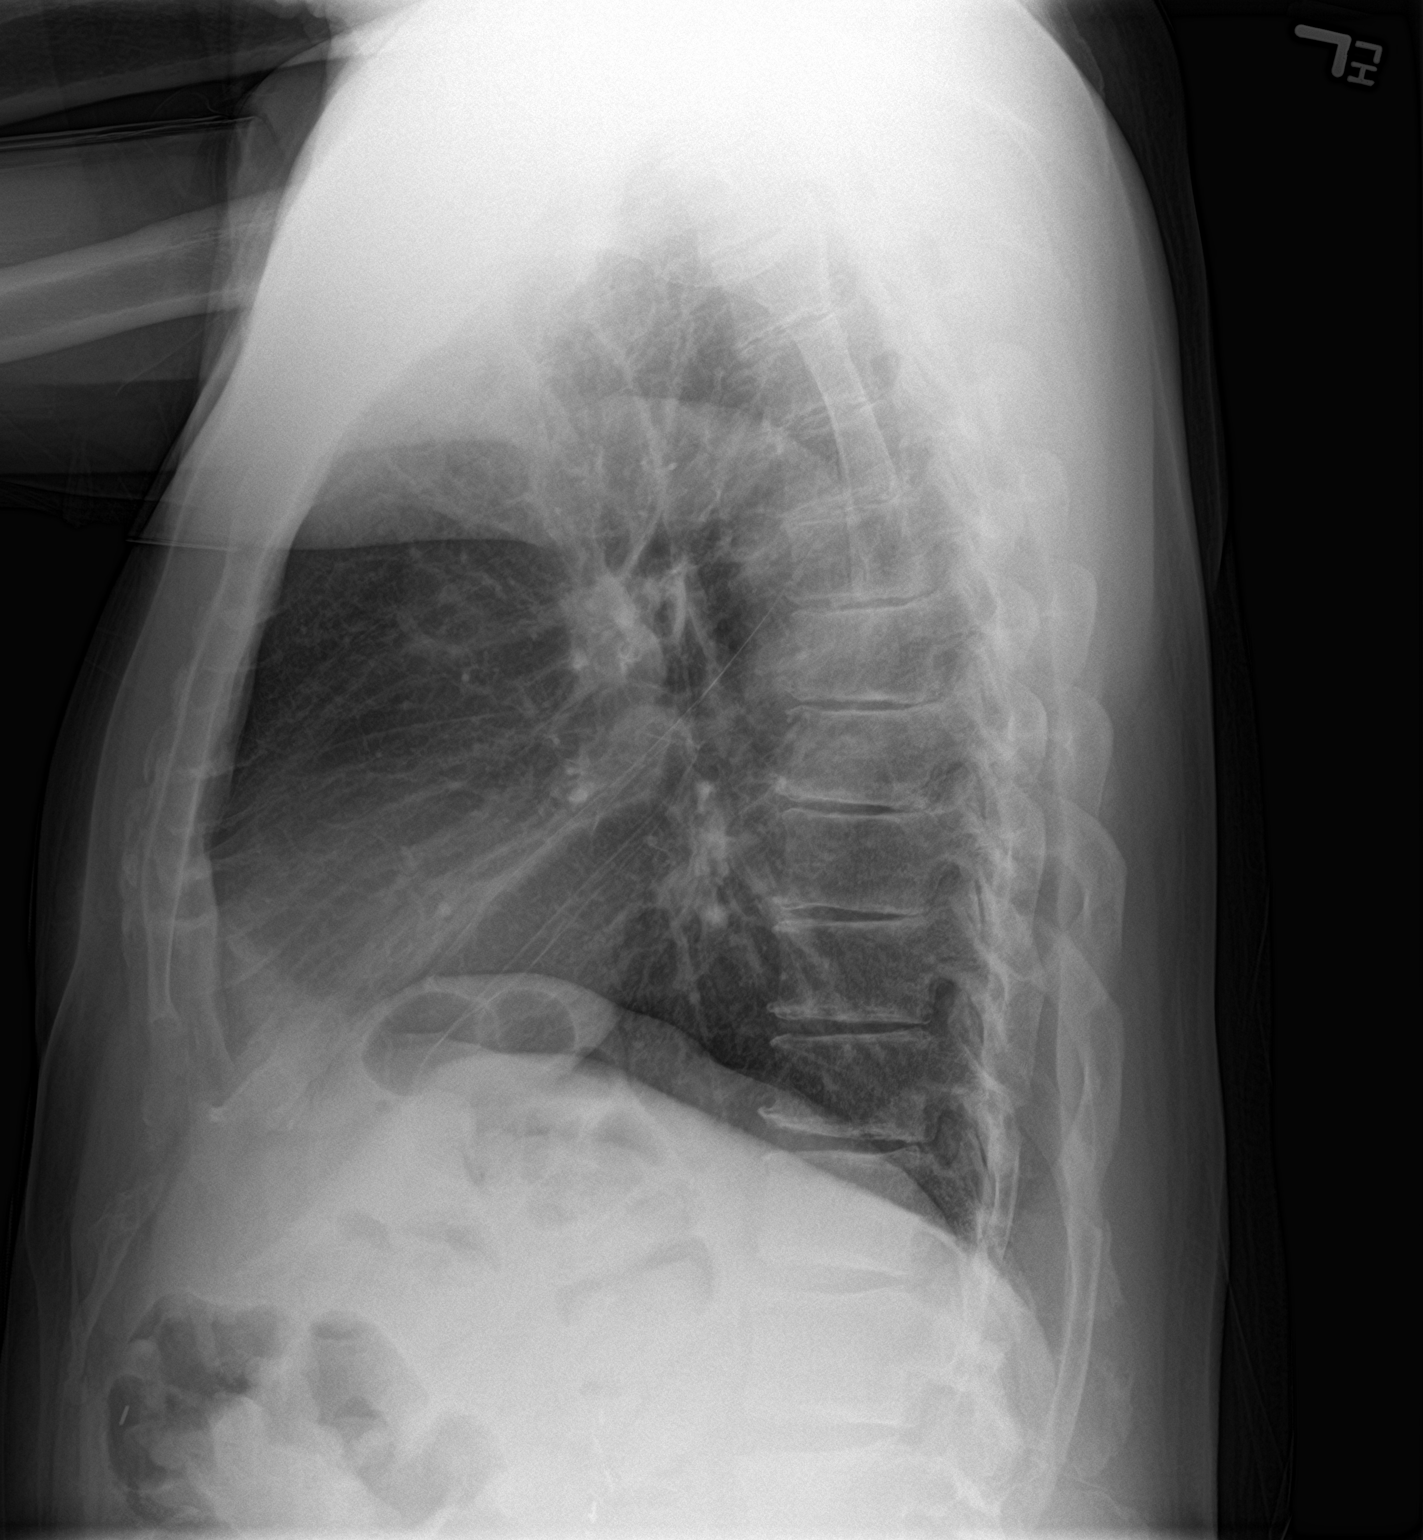

[2 of 2 positions shown; findings below may reference images not displayed]

FINDINGS: The heart size and mediastinal contours are within normal limits.
Aortic atherosclerotic calcification noted. Both lungs are clear.
The visualized skeletal structures are unremarkable.
IMPRESSION: No active cardiopulmonary disease.

## 2022-10-16 ENCOUNTER — Emergency Department
Admission: EM | Admit: 2022-10-16 | Discharge: 2022-10-16 | Disposition: A | Discharge status: Home / Self Care | Payer: 59 | Attending: Emergency Medicine | Admitting: Emergency Medicine

## 2022-10-16 ENCOUNTER — Other Ambulatory Visit: Payer: Self-pay

## 2022-10-16 ENCOUNTER — Encounter: Payer: Self-pay | Admitting: Emergency Medicine

## 2022-10-16 DIAGNOSIS — R319 Hematuria, unspecified: Secondary | ICD-10-CM

## 2022-10-16 DIAGNOSIS — Z8551 Personal history of malignant neoplasm of bladder: Secondary | ICD-10-CM | POA: Diagnosis not present

## 2022-10-16 LAB — URINALYSIS, ROUTINE W REFLEX MICROSCOPIC
Bacteria, UA: NONE SEEN
Bilirubin Urine: NEGATIVE
Glucose, UA: 50 mg/dL — AB
Hgb urine dipstick: NEGATIVE
Ketones, ur: NEGATIVE mg/dL
Leukocytes,Ua: NEGATIVE
Nitrite: NEGATIVE
Protein, ur: 30 mg/dL — AB
Specific Gravity, Urine: 1.025 (ref 1.005–1.030)
Squamous Epithelial / HPF: NONE SEEN /HPF (ref 0–5)
pH: 5 (ref 5.0–8.0)

## 2022-10-16 LAB — CBC
HCT: 39.8 % (ref 39.0–52.0)
Hemoglobin: 13.1 g/dL (ref 13.0–17.0)
MCH: 29.4 pg (ref 26.0–34.0)
MCHC: 32.9 g/dL (ref 30.0–36.0)
MCV: 89.4 fL (ref 80.0–100.0)
Platelets: 151 10*3/uL (ref 150–400)
RBC: 4.45 MIL/uL (ref 4.22–5.81)
RDW: 14.7 % (ref 11.5–15.5)
WBC: 5.2 10*3/uL (ref 4.0–10.5)
nRBC: 0 % (ref 0.0–0.2)

## 2022-10-16 LAB — BASIC METABOLIC PANEL
Anion gap: 6 (ref 5–15)
BUN: 20 mg/dL (ref 8–23)
CO2: 22 mmol/L (ref 22–32)
Calcium: 8.6 mg/dL — ABNORMAL LOW (ref 8.9–10.3)
Chloride: 110 mmol/L (ref 98–111)
Creatinine, Ser: 1.1 mg/dL (ref 0.61–1.24)
GFR, Estimated: >60 mL/min (ref >60)
Glucose, Bld: 107 mg/dL — ABNORMAL HIGH (ref 70–99)
Potassium: 3.8 mmol/L (ref 3.5–5.1)
Sodium: 138 mmol/L (ref 135–145)

## 2022-10-16 NOTE — ED Notes (Signed)
Pt discharge to home. Pt VSS, GCS 15, NAD. Pt verbalized understanding of discharge instructions with no additional questions at this time.  

## 2022-10-16 NOTE — ED Provider Notes (Signed)
[facility] Provider Note    Event Date/Time   First MD Initiated Contact with Patient 10/16/22 1340     (approximate)   History   Hematuria   HPI  John Norris is a 67 y.o. male past medical history of open AAA repair and bladder cancer in remission who presents because of blood in his urine.  2 days ago patient noticed blood in his urine.  Initially said it looked dark and then he noticed bright red blood was not passing clots still peeing fine.  This resolved yesterday.  He has not had any blood in his urine today but he is concerned that it could be related to his aneurysm.  He denies abdominal pain flank pain nausea vomiting fever or chills.  No difficulty urinating currently     Past Medical History:  Diagnosis Date   Abdominal aortic aneurysm (AAA) without rupture    Roughly 5 cm partially thrombotic. Also, treated by bilateral iliac aneurysms   Bladder cancer    Chest pain    2mm ST elevation in inferior lead, cath 12/18/2015 55% ost D1 stenosis, otherwise clean coronary, no culprit lesion found. Echo normal EF.   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   ST elevation myocardial infarction (STEMI) of inferolateral wall,-- ruled out 12/18/2015   Abdominal aortic aneurysm rupture (HCC) 5.3 cm, partially thrombosed 12/18/2015   Precordial chest pain 12/18/2015   Ischemic chest pain    ST elevation myocardial infarction (STEMI)      Physical Exam  Triage Vital Signs: ED Triage Vitals  Enc Vitals Group     BP 10/16/22 1145 (!) 154/85     Pulse Rate 10/16/22 1145 76     Resp 10/16/22 1145 18     Temp 10/16/22 1145 97.7 F (36.5 C)     Temp Source 10/16/22 1145 Oral     SpO2 10/16/22 1145 99 %     Weight 10/16/22 1146 176 lb (79.8 kg)     Height 10/16/22 1146  (1.676 m)     Head Circumference --      Peak Flow --      Pain Score 10/16/22 1146 0     Pain Loc --       Pain Edu? --      Excl. in GC? --     Most recent vital signs: Vitals:   10/16/22 1145 10/16/22 1402  BP: (!) 154/85 126/88  Pulse: 76 62  Resp: 18 16  Temp: 97.7 F (36.5 C) 97.9 F (36.6 C)  SpO2: 99% 97%     General: Awake, no distress.  CV:  Good peripheral perfusion.  Resp:  Normal effort.  Abd:  No distention.  Abdomen is soft and nontender throughout Neuro:             Awake, Alert, Oriented x 3  Other:     ED Results / Procedures / Treatments  Labs (all labs ordered are listed, but only abnormal results are displayed) Labs Reviewed  URINALYSIS, ROUTINE W REFLEX MICROSCOPIC - Abnormal; Notable for the following components:      Result Value   Color, Urine YELLOW (*)    APPearance CLEAR (*)    Glucose, UA 50 (*)    Protein, ur 30 (*)    All other components within normal limits  BASIC METABOLIC PANEL - Abnormal; Notable for the following  components:   Glucose, Bld 107 (*)    Calcium 8.6 (*)    All other components within normal limits  CBC     EKG     RADIOLOGY    PROCEDURES:  Critical Care performed: No  Procedures ges.   MEDICATIONS ORDERED IN ED: Medications - No data to display   IMPRESSION / MDM / ASSESSMENT AND PLAN / ED COURSE  I reviewed the triage vital signs and the nursing notes.                              Patient's presentation is most consistent with acute, uncomplicated illness.  Differential diagnosis includes, but is not limited to, UTI, kidney stone, recurrent bladder mass  Patient is a 67 year old male with history of AAA repair and bladder cancer in remission who presents because of hematuria.  This started 2 days ago but resolved yesterday.  He noticed bright red blood in the urine no difficulty urinating fevers chills flank pain.  Denies abdominal pain.  His bleeding stopped yesterday and has not had any blood in the urine today that he is noticed.  Patient's vital signs are reassuring on exam he looks well  abdominal exam is benign.  CBC does not have any leukocytosis or anemia that renal function is near baseline.  Urinalysis has no red blood cells or white blood cells.  This is reassuring given he is otherwise asymptomatic do not feel that he needs further workup at this time.  Did discuss with him that he even notices any additional hematuria he should follow-up with his urologist at [facility].       FINAL CLINICAL IMPRESSION(S) / ED DIAGNOSES   Final diagnoses:  Hematuria, unspecified type     Rx / DC Orders   ED Discharge Orders     None        Note:  This document was prepared using Dragon voice recognition software and may include unintentional dictation errors.   Georga Hacking, MD 10/16/22 [phone]

## 2022-10-16 NOTE — ED Triage Notes (Signed)
Patient to ED via POV for blood in urine- 2 days ago. States urine is back to normal today. Hx of bladder cancer but currently cancer free. Denies pain.

## 2022-10-16 NOTE — Discharge Instructions (Signed)
Your urine did not have any blood in it today.  If you continue to notice intermittent blood in your urine please follow-up with a urologist.

## 2024-07-09 ENCOUNTER — Other Ambulatory Visit: Payer: Self-pay

## 2024-07-09 ENCOUNTER — Encounter: Payer: Self-pay | Admitting: Emergency Medicine

## 2024-07-09 DIAGNOSIS — R509 Fever, unspecified: Secondary | ICD-10-CM | POA: Diagnosis present

## 2024-07-09 DIAGNOSIS — D72819 Decreased white blood cell count, unspecified: Secondary | ICD-10-CM | POA: Insufficient documentation

## 2024-07-09 DIAGNOSIS — Z8551 Personal history of malignant neoplasm of bladder: Secondary | ICD-10-CM | POA: Diagnosis not present

## 2024-07-09 DIAGNOSIS — J111 Influenza due to unidentified influenza virus with other respiratory manifestations: Secondary | ICD-10-CM | POA: Diagnosis not present

## 2024-07-09 DIAGNOSIS — Z7982 Long term (current) use of aspirin: Secondary | ICD-10-CM | POA: Diagnosis not present

## 2024-07-09 NOTE — ED Triage Notes (Signed)
 Pt presents to the ED via POV with complaints of bodyaches x 2 days. Notes being exposed to someone with the flu. He has taken OTC cough medication with no improvement in sx. A&Ox4 at this time. Denies CP or SOB.

## 2024-07-10 ENCOUNTER — Emergency Department

## 2024-07-10 ENCOUNTER — Emergency Department
Admission: EM | Admit: 2024-07-10 | Discharge: 2024-07-10 | Disposition: A | Discharge status: Home / Self Care | Attending: Emergency Medicine | Admitting: Emergency Medicine

## 2024-07-10 DIAGNOSIS — J111 Influenza due to unidentified influenza virus with other respiratory manifestations: Secondary | ICD-10-CM

## 2024-07-10 LAB — CBC WITH DIFFERENTIAL/PLATELET
Abs Immature Granulocytes: 0 K/uL (ref 0.00–0.07)
Basophils Absolute: 0 K/uL (ref 0.0–0.1)
Basophils Relative: 0 %
Eosinophils Absolute: 0 K/uL (ref 0.0–0.5)
Eosinophils Relative: 1 %
HCT: 41.7 % (ref 39.0–52.0)
Hemoglobin: 13.7 g/dL (ref 13.0–17.0)
Immature Granulocytes: 0 %
Lymphocytes Relative: 36 %
Lymphs Abs: 1.3 K/uL (ref 0.7–4.0)
MCH: 30.8 pg (ref 26.0–34.0)
MCHC: 32.9 g/dL (ref 30.0–36.0)
MCV: 93.7 fL (ref 80.0–100.0)
Monocytes Absolute: 0.4 K/uL (ref 0.1–1.0)
Monocytes Relative: 9 %
Neutro Abs: 2 K/uL (ref 1.7–7.7)
Neutrophils Relative %: 54 %
Platelets: 120 K/uL — ABNORMAL LOW (ref 150–400)
RBC: 4.45 MIL/uL (ref 4.22–5.81)
RDW: 15.1 % (ref 11.5–15.5)
WBC: 3.7 K/uL — ABNORMAL LOW (ref 4.0–10.5)
nRBC: 0 % (ref 0.0–0.2)

## 2024-07-10 LAB — COMPREHENSIVE METABOLIC PANEL WITH GFR
ALT: 25 U/L (ref 0–44)
AST: 34 U/L (ref 15–41)
Albumin: 3.8 g/dL (ref 3.5–5.0)
Alkaline Phosphatase: 108 U/L (ref 38–126)
Anion gap: 9 (ref 5–15)
BUN: 20 mg/dL (ref 8–23)
CO2: 25 mmol/L (ref 22–32)
Calcium: 8.7 mg/dL — ABNORMAL LOW (ref 8.9–10.3)
Chloride: 104 mmol/L (ref 98–111)
Creatinine, Ser: 1.16 mg/dL (ref 0.61–1.24)
GFR, Estimated: >60 mL/min
Glucose, Bld: 134 mg/dL — ABNORMAL HIGH (ref 70–99)
Potassium: 4 mmol/L (ref 3.5–5.1)
Sodium: 138 mmol/L (ref 135–145)
Total Bilirubin: 0.5 mg/dL (ref 0.0–1.2)
Total Protein: 7.5 g/dL (ref 6.5–8.1)

## 2024-07-10 LAB — URINALYSIS, ROUTINE W REFLEX MICROSCOPIC
Bacteria, UA: NONE SEEN
Bilirubin Urine: NEGATIVE
Glucose, UA: NEGATIVE mg/dL
Ketones, ur: NEGATIVE mg/dL
Leukocytes,Ua: NEGATIVE
Nitrite: NEGATIVE
Protein, ur: 30 mg/dL — AB
Specific Gravity, Urine: 1.024 (ref 1.005–1.030)
pH: 5 (ref 5.0–8.0)

## 2024-07-10 LAB — TROPONIN T, HIGH SENSITIVITY: Troponin T High Sensitivity: <15 ng/L (ref 0–19)

## 2024-07-10 LAB — LIPASE, BLOOD: Lipase: 66 U/L — ABNORMAL HIGH (ref 11–51)

## 2024-07-10 MED ORDER — ACETAMINOPHEN 500 MG PO TABS
1000.0000 mg | ORAL_TABLET | Freq: Once | ORAL | Status: AC
  Administered 2024-07-10: 1000 mg via ORAL
  Filled 2024-07-10: qty 2

## 2024-07-10 NOTE — ED Provider Notes (Signed)
 "  [facility] Provider Note    Event Date/Time   First MD Initiated Contact with Patient 07/10/24 0008     (approximate)   History   Generalized Body Aches   HPI  John Norris is a 69 y.o. male with history of AAA, bladder cancer who presents to the emergency department with complaints of flulike symptoms.  States he has had cough, congestion, body aches and fevers for 4 days.  No vomiting or diarrhea.  Recently exposed to someone that was flu positive.  Has had some chest discomfort but no shortness of breath.  Abdomen tender to palpation but he states that this is chronic for him.   History provided by patient.    Past Medical History:  Diagnosis Date   Abdominal aortic aneurysm (AAA) without rupture    Roughly 5 cm partially thrombotic. Also, treated by bilateral iliac aneurysms   Bladder cancer (HCC)    Chest pain    2mm ST elevation in inferior lead, cath 12/18/2015 55% ost D1 stenosis, otherwise clean coronary, no culprit lesion found. Echo normal EF.   GI bleed May 2017    Past Surgical History:  Procedure Laterality Date   CARDIAC CATHETERIZATION N/A 12/18/2015   Procedure: Left Heart Cath and Coronary Angiography;  Surgeon: Alm LELON Clay, MD;  Location: [facility] INVASIVE CV LAB;  Service: Cardiovascular;  Laterality: N/A;   Exploratory abdominal surgery     for what was possibly appendicitis versus tumor    MEDICATIONS:  Prior to Admission medications  Medication Sig Start Date End Date Taking? Authorizing Provider  acetaminophen -codeine (TYLENOL  #3) 300-30 MG tablet take 1 tablet by mouth every 4 to 6 hours if needed for pain 05/17/17   [provider]  albuterol  (VENTOLIN  HFA) 108 (90 Base) MCG/ACT inhaler Inhale 2 puffs into the lungs every 6 (six) hours as needed for wheezing or shortness of breath. 10/22/21   Jossie Artist POUR, MD  aspirin  EC 81 MG tablet Take 81 mg by mouth daily.    [provider]  atorvastatin   (LIPITOR ) 40 MG tablet Take 1 tablet (40 mg total) by mouth daily at 6 PM. 12/20/15   Janene Boer, PA  predniSONE  (STERAPRED UNI-PAK 21 TAB) 10 MG (21) TBPK tablet As directed on packaging 10/22/21   Jossie Artist POUR, MD  Spacer/Aero-Holding Chambers (AEROCHAMBER MV) inhaler Use as instructed 10/22/21   Jossie Artist POUR, MD    Physical Exam   Triage Vital Signs: ED Triage Vitals [07/09/24 2239]  Encounter Vitals Group     BP (!) 121/90     Girls Systolic BP Percentile      Girls Diastolic BP Percentile      Boys Systolic BP Percentile      Boys Diastolic BP Percentile      Pulse Rate 88     Resp 18     Temp 97.8 F (36.6 C)     Temp Source Oral     SpO2 98 %     Weight 184 lb (83.5 kg)     Height 5' 6 (1.676 m)     Head Circumference      Peak Flow      Pain Score 10     Pain Loc      Pain Education      Exclude from Growth Chart     Most recent vital signs: Vitals:   07/09/24 2239 07/09/24 2240  BP: (!) 121/90 (!) 121/90  Pulse: 88  Resp: 18   Temp: 97.8 F (36.6 C)   SpO2: 98%     CONSTITUTIONAL: Alert, responds appropriately to questions.  Elderly, chronically ill-appearing HEAD: Normocephalic, atraumatic EYES: Conjunctivae clear, pupils appear equal, sclera nonicteric ENT: normal nose; moist mucous membranes NECK: Supple, normal ROM CARD: RRR; S1 and S2 appreciated RESP: Normal chest excursion without splinting or tachypnea; breath sounds clear and equal bilaterally; no wheezes, no rhonchi, no rales, no hypoxia or respiratory distress, speaking full sentences ABD/GI: Non-distended; soft, mildly tender to palpation diffusely without guarding or rebound which patient states is chronic BACK: The back appears normal EXT: Normal ROM in all joints; no deformity noted, no edema SKIN: Normal color for age and race; warm; no rash on exposed skin NEURO: Moves all extremities equally, normal speech PSYCH: The patient's mood and manner are appropriate.   ED Results /  Procedures / Treatments   LABS: (all labs ordered are listed, but only abnormal results are displayed) Labs Reviewed  CBC WITH DIFFERENTIAL/PLATELET - Abnormal; Notable for the following components:      Result Value   WBC 3.7 (*)    Platelets 120 (*)    All other components within normal limits  COMPREHENSIVE METABOLIC PANEL WITH GFR - Abnormal; Notable for the following components:   Glucose, Bld 134 (*)    Calcium  8.7 (*)    All other components within normal limits  LIPASE, BLOOD - Abnormal; Notable for the following components:   Lipase 66 (*)    All other components within normal limits  URINALYSIS, ROUTINE W REFLEX MICROSCOPIC - Abnormal; Notable for the following components:   Color, Urine YELLOW (*)    APPearance CLEAR (*)    Hgb urine dipstick SMALL (*)    Protein, ur 30 (*)    All other components within normal limits  TROPONIN T, HIGH SENSITIVITY     EKG:  EKG Interpretation Date/Time:  Thursday July 10 2024 01:12:50 EST Ventricular Rate:  84 PR Interval:  174 QRS Duration:  96 QT Interval:  366 QTC Calculation: 432 R Axis:   71  Text Interpretation: Normal sinus rhythm Normal ECG When compared with ECG of 18-Dec-2015 05:40, No significant change was found Confirmed by Neomi Neptune [phone]) on 07/10/2024 1:52:32 AM         RADIOLOGY: My personal review and interpretation of imaging: Chest x-ray clear.  I have personally reviewed all radiology reports.   DG Chest Portable 1 View Result Date: 07/10/2024 EXAM: 1 VIEW(S) XRAY OF THE CHEST 07/10/2024 01:21:00 AM COMPARISON: Comparison with 10/22/2021. CLINICAL HISTORY: SOB. FINDINGS: LUNGS AND PLEURA: Lungs are clear. No pleural effusion. No pneumothorax. HEART AND MEDIASTINUM: Heart size and pulmonary vascularity are normal. Mediastinal contours appear intact. Calcification of the aorta. BONES AND SOFT TISSUES: No acute osseous abnormality. IMPRESSION: 1. No acute process. Electronically signed by: Elsie Gravely MD 07/10/2024 01:23 AM EST RP Workstation: [id number]     PROCEDURES:  Critical Care performed: No      Procedures    IMPRESSION / MDM / ASSESSMENT AND PLAN / ED COURSE  I reviewed the triage vital signs and the nursing notes.    Patient here with complaints of chest pain, flulike symptoms.  Recent flu positive exposure.     DIFFERENTIAL DIAGNOSIS (includes but not limited to):   Influenza, other viral URI, pneumonia, seems less likely to be ACS, CHF, PE.  Doubt dissection or pneumothorax.   Patient's presentation is most consistent with acute presentation with potential threat to  life or bodily function.   PLAN: EKG shows no new ischemic change.  Will obtain labs, chest x-ray.  Will give Tylenol  for symptomatic relief.  Given symptoms ongoing for 4 days and outside of treatment window for antivirals, no indication for routine flu testing especially given he had a flu positive contact.  Discussed with patient that he likely has influenza.   MEDICATIONS GIVEN IN ED: Medications  acetaminophen  (TYLENOL ) tablet 1,000 mg (1,000 mg Oral Given 07/10/24 0109)     ED COURSE: Patient reports feeling better.  Tolerating p.o.  Mild leukopenia consistent with viral illness.  Normal creatinine, LFTs and lipase.  Troponin negative.  Given chest pain ongoing for 4 days, no indication for serial enzymes.  Chest x-ray reviewed and interpreted by myself and the radiologist and is clear.  I feel he is safe to be discharged.  Discussed supportive care instructions for home and return precautions.  Patient has been able to eat and drink here without any difficulty.   At this time, I do not feel there is any life-threatening condition present. I reviewed all nursing notes, vitals, pertinent previous records.  All lab and urine results, EKGs, imaging ordered have been independently reviewed and interpreted by myself.  I reviewed all available radiology reports from any imaging ordered this  visit.  Based on my assessment, I feel the patient is safe to be discharged home without further emergent workup and can continue workup as an outpatient as needed. Discussed all findings, treatment plan as well as usual and customary return precautions.  They verbalize understanding and are comfortable with this plan.  Outpatient follow-up has been provided as needed.  All questions have been answered.    CONSULTS:  none   OUTSIDE RECORDS REVIEWED: Reviewed last urology notes in 2019.       FINAL CLINICAL IMPRESSION(S) / ED DIAGNOSES   Final diagnoses:  Influenza-like illness     Rx / DC Orders   ED Discharge Orders     None        Note:  This document was prepared using Dragon voice recognition software and may include unintentional dictation errors.   Tianni Escamilla, Josette SAILOR, DO 07/10/24 0345  "

## 2024-07-10 NOTE — Discharge Instructions (Signed)
 You may take Tylenol  1000 mg every 6 hours as needed for pain and fever.  Please rest and drink plenty of fluids. This is a viral illness causing your symptoms. You do not need antibiotics for a virus. You may use over-the-counter nasal saline spray and Afrin nasal saline spray as needed for nasal congestion. Please do not use Afrin for more than 3 days in a row. You may use guaifenesin and dextromethorphan as needed for cough.  You may use lozenges and Chloraseptic spray to help with sore throat.  Warm salt water gargles can also help with sore throat.  You may use over-the-counter Unisom (doxyalamine) or Benadryl (diphenhydramine) to help with sleep.  Please note that some combination medicines such as DayQuil and NyQuil have multiple medications in them.  Please make sure you look at all labels to ensure that you are not taking too much of any one particular medication.  Symptoms from a virus may take 7-14 days to run its course.  You may also use honey and salt water gargles to help with sore throat, cough, postnasal drainage.  The flu is treated like any other virus with supportive measures as listed above. At this time you are outside the treatment window for Tamiflu or Xofluza. These medications have to be taken within the first 48 hours of symptoms.  Tamiflu has many side effects including nausea, vomiting and diarrhea.

## 2024-07-10 NOTE — ED Notes (Signed)
 ED Provider at bedside.
